# Patient Record
Sex: Male | Born: 1935 | Race: White | Hispanic: No | Marital: Married | Smoking: Former smoker
Health system: Southern US, Community
[De-identification: ages and names within clinical notes are randomized; demographics above are authoritative.]

## PROBLEM LIST (undated history)

## (undated) DIAGNOSIS — F329 Major depressive disorder, single episode, unspecified: Secondary | ICD-10-CM

## (undated) DIAGNOSIS — E785 Hyperlipidemia, unspecified: Secondary | ICD-10-CM

## (undated) DIAGNOSIS — C801 Malignant (primary) neoplasm, unspecified: Secondary | ICD-10-CM

## (undated) DIAGNOSIS — E079 Disorder of thyroid, unspecified: Secondary | ICD-10-CM

## (undated) DIAGNOSIS — Z85048 Personal history of other malignant neoplasm of rectum, rectosigmoid junction, and anus: Secondary | ICD-10-CM

## (undated) DIAGNOSIS — F32A Depression, unspecified: Secondary | ICD-10-CM

## (undated) HISTORY — DX: Major depressive disorder, single episode, unspecified: F32.9

## (undated) HISTORY — PX: LARYNGECTOMY: SUR815

## (undated) HISTORY — PX: BACK SURGERY: SHX140

## (undated) HISTORY — DX: Disorder of thyroid, unspecified: E07.9

## (undated) HISTORY — PX: WRIST SURGERY: SHX841

## (undated) HISTORY — DX: Depression, unspecified: F32.A

## (undated) HISTORY — DX: Personal history of other malignant neoplasm of rectum, rectosigmoid junction, and anus: Z85.048

## (undated) HISTORY — DX: Hyperlipidemia, unspecified: E78.5

## (undated) HISTORY — PX: LUNG BIOPSY: SHX232

---

## 1999-06-20 DIAGNOSIS — Z8521 Personal history of malignant neoplasm of larynx: Secondary | ICD-10-CM | POA: Insufficient documentation

## 2010-06-19 DIAGNOSIS — Z8619 Personal history of other infectious and parasitic diseases: Secondary | ICD-10-CM | POA: Insufficient documentation

## 2010-06-19 DIAGNOSIS — Z85048 Personal history of other malignant neoplasm of rectum, rectosigmoid junction, and anus: Secondary | ICD-10-CM | POA: Insufficient documentation

## 2010-06-19 DIAGNOSIS — Z8781 Personal history of (healed) traumatic fracture: Secondary | ICD-10-CM | POA: Insufficient documentation

## 2011-02-04 ENCOUNTER — Emergency Department: Payer: Self-pay | Admitting: *Deleted

## 2011-02-05 DIAGNOSIS — S22009A Unspecified fracture of unspecified thoracic vertebra, initial encounter for closed fracture: Secondary | ICD-10-CM | POA: Insufficient documentation

## 2011-04-12 ENCOUNTER — Ambulatory Visit: Payer: Self-pay | Admitting: Internal Medicine

## 2011-07-27 DIAGNOSIS — B449 Aspergillosis, unspecified: Secondary | ICD-10-CM | POA: Insufficient documentation

## 2011-07-27 DIAGNOSIS — C20 Malignant neoplasm of rectum: Secondary | ICD-10-CM | POA: Insufficient documentation

## 2012-05-20 DIAGNOSIS — C329 Malignant neoplasm of larynx, unspecified: Secondary | ICD-10-CM | POA: Insufficient documentation

## 2017-11-21 DIAGNOSIS — Z930 Tracheostomy status: Secondary | ICD-10-CM | POA: Insufficient documentation

## 2018-01-18 ENCOUNTER — Ambulatory Visit: Payer: Self-pay | Admitting: Urology

## 2018-02-11 ENCOUNTER — Ambulatory Visit (INDEPENDENT_AMBULATORY_CARE_PROVIDER_SITE_OTHER): Payer: Medicare Other | Admitting: Urology

## 2018-02-11 ENCOUNTER — Encounter: Payer: Self-pay | Admitting: Urology

## 2018-02-11 VITALS — BP 136/75 | HR 67 | Ht 63.0 in | Wt 155.0 lb

## 2018-02-11 DIAGNOSIS — N138 Other obstructive and reflux uropathy: Secondary | ICD-10-CM | POA: Diagnosis not present

## 2018-02-11 DIAGNOSIS — N401 Enlarged prostate with lower urinary tract symptoms: Secondary | ICD-10-CM | POA: Diagnosis not present

## 2018-02-11 DIAGNOSIS — R972 Elevated prostate specific antigen [PSA]: Secondary | ICD-10-CM

## 2018-02-11 LAB — URINALYSIS, COMPLETE
Bilirubin, UA: NEGATIVE
GLUCOSE, UA: NEGATIVE
KETONES UA: NEGATIVE
Leukocytes, UA: NEGATIVE
NITRITE UA: NEGATIVE
Protein, UA: NEGATIVE
RBC, UA: NEGATIVE
Specific Gravity, UA: 1.02 (ref 1.005–1.030)
UUROB: 0.2 mg/dL (ref 0.2–1.0)
pH, UA: 6.5 (ref 5.0–7.5)

## 2018-02-11 LAB — MICROSCOPIC EXAMINATION
EPITHELIAL CELLS (NON RENAL): NONE SEEN /HPF (ref 0–10)
WBC UA: NONE SEEN /HPF (ref 0–5)

## 2018-02-11 NOTE — Progress Notes (Signed)
02/11/2018 10:13 AM   Ryan Vazquez [birth year] [id number]  Referring provider: Adin Hector, MD [facility] [place], [place] [address]  Chief Complaint  Patient presents with  . Elevated PSA    HPI: 83 year old male who saw Dr. Caryl Comes in early June 2019 with worsening lower urinary tract symptoms which occurred after ENT surgery for a tracheostomy revision.  He was started on tamsulosin and empiric antibiotic therapy.  He subsequently discontinued the tamsulosin.  A PSA was drawn which was 8.62.  There were no prior PSA results for comparison.  He presents today with his daughter.  He is presently back at baseline regarding his voiding symptoms.  He has urinary frequency and occasional episodes of urge incontinence.  He states the symptoms are not bothersome.  Denies dysuria or gross hematuria.  Denies flank, abdominal, pelvic or scrotal pain.   PMH: Past Medical History:  Diagnosis Date  . Depression   . History of rectal cancer   . Hyperlipidemia   . Thyroid disease     Surgical History: Past Surgical History:  Procedure Laterality Date  . LARYNGECTOMY    . WRIST SURGERY      Home Medications:  Allergies as of 02/11/2018      Reactions   Lactose    Other reaction(s): Other (See Comments), Other (See Comments)      Medication List        Accurate as of 02/11/18 10:13 AM. Always use your most recent med list.          acetaminophen 325 MG tablet Commonly known as:  TYLENOL Take by mouth.   fexofenadine 180 MG tablet Commonly known as:  ALLEGRA Take by mouth.   ibuprofen 200 MG tablet Commonly known as:  ADVIL,MOTRIN Take by mouth.   levothyroxine 50 MCG tablet Commonly known as:  SYNTHROID, LEVOTHROID TAKE 1 TABLET ON AN EMPTY STOMACH WITH A GLASS OF WATER AT LEAST 30-60 MINUTES BEFORE BREAKFAST.   montelukast 10 MG tablet Commonly known as:  SINGULAIR Take by mouth.       Allergies:  Allergies    Allergen Reactions  . Lactose     Other reaction(s): Other (See Comments), Other (See Comments)    Family History: Family History  Problem Relation Age of Onset  . Heart disease Father     Social History:  reports that he has never smoked. He has never used smokeless tobacco. He reports that he drinks alcohol. He reports that he does not use drugs.  ROS: UROLOGY Frequent Urination?: Yes Hard to postpone urination?: No Burning/pain with urination?: No Get up at night to urinate?: No Leakage of urine?: Yes Urine stream starts and stops?: No Trouble starting stream?: No Do you have to strain to urinate?: No Blood in urine?: No Urinary tract infection?: No Sexually transmitted disease?: No Injury to kidneys or bladder?: No Painful intercourse?: No Weak stream?: No Erection problems?: No Penile pain?: No  Gastrointestinal Nausea?: No Vomiting?: No Indigestion/heartburn?: No Diarrhea?: No Constipation?: No  Constitutional Fever: No Night sweats?: No Weight loss?: No Fatigue?: No  Skin Skin rash/lesions?: No Itching?: No  Eyes Blurred vision?: No Double vision?: No  Ears/Nose/Throat Sore throat?: No Sinus problems?: No  Hematologic/Lymphatic Swollen glands?: No Easy bruising?: No  Cardiovascular Leg swelling?: No Chest pain?: No  Respiratory Cough?: No Shortness of breath?: No  Endocrine Excessive thirst?: No  Musculoskeletal Back pain?: No Joint pain?: No  Neurological Headaches?: No Dizziness?: No  Psychologic  Depression?: No Anxiety?: No  Physical Exam: BP 136/75 (BP Location: Left Arm, Patient Position: Sitting, Cuff Size: Normal)   Pulse 67   Ht 5\' 3"  (1.6 m)   Wt 155 lb (70.3 kg)   BMI 27.46 kg/m   Constitutional:  Alert and oriented, No acute distress. HEENT: [place] AT, moist mucus membranes.  Trachea midline, no masses. Cardiovascular: No clubbing, cyanosis, or edema. Respiratory: Normal respiratory effort, no increased work  of breathing. GI: Abdomen is soft, nontender, nondistended, no abdominal masses GU: No CVA tenderness.  Prostate 60 g, smooth without nodules Lymph: No cervical or inguinal lymphadenopathy. Skin: No rashes, bruises or suspicious lesions. Neurologic: Grossly intact, no focal deficits, moving all 4 extremities. Psychiatric: Normal mood and affect.   Assessment & Plan:    1. BPH with obstruction/lower urinary tract symptoms His lower urinary tract symptoms are not bothersome and he does not desire medical management of his symptoms.  2. Elevated PSA His DRE is benign.  Based on AUA recommendations prostate cancer screening after age 66 is not recommended.  He and his daughter were informed that approximately 50% of men in their 37s will have low-grade prostate cancer.  Other causes of an elevated PSA were discussed including BPH and inflammation.  I discussed options of prostate biopsy versus surveillance.  They would like to think this over.  Will repeat his PSA today.   Ryan Vazquez, Ryan Vazquez [address], [place], [place] [address] [phone]

## 2018-02-12 LAB — PSA: Prostate Specific Ag, Serum: 6.5 ng/mL — ABNORMAL HIGH (ref 0.0–4.0)

## 2018-02-13 ENCOUNTER — Telehealth: Payer: Self-pay

## 2018-02-13 DIAGNOSIS — R972 Elevated prostate specific antigen [PSA]: Secondary | ICD-10-CM

## 2018-02-13 DIAGNOSIS — N401 Enlarged prostate with lower urinary tract symptoms: Principal | ICD-10-CM

## 2018-02-13 DIAGNOSIS — N138 Other obstructive and reflux uropathy: Secondary | ICD-10-CM

## 2018-02-13 NOTE — Telephone Encounter (Signed)
Pt daughter informed

## 2018-02-13 NOTE — Telephone Encounter (Signed)
-----   Message from [name], MD sent at 02/12/2018  8:45 PM EDT ----- PSA better at 6.5.  Recc repeat psa 6 months

## 2018-08-12 ENCOUNTER — Other Ambulatory Visit: Payer: Medicare Other

## 2018-08-12 DIAGNOSIS — N401 Enlarged prostate with lower urinary tract symptoms: Principal | ICD-10-CM

## 2018-08-12 DIAGNOSIS — N138 Other obstructive and reflux uropathy: Secondary | ICD-10-CM

## 2018-08-12 DIAGNOSIS — R972 Elevated prostate specific antigen [PSA]: Secondary | ICD-10-CM

## 2018-08-13 LAB — PSA: PROSTATE SPECIFIC AG, SERUM: 6.4 ng/mL — AB (ref 0.0–4.0)

## 2018-08-14 ENCOUNTER — Telehealth: Payer: Self-pay | Admitting: Family Medicine

## 2018-08-14 NOTE — Telephone Encounter (Signed)
-----   Message from [name], MD sent at 08/13/2018  1:14 PM EST ----- Repeat PSA is stable at 6.4.  Since the PSA is stable surveillance can be continued versus scheduling a prostate biopsy.  If he desires surveillance would recheck in 6 months.  Would be happy to see him in follow-up to discuss options if he desires.

## 2018-08-14 NOTE — Telephone Encounter (Signed)
Patient notified and he will think about what he will want to do. He also wants to talk to his PCP first.

## 2018-09-01 DIAGNOSIS — E7849 Other hyperlipidemia: Secondary | ICD-10-CM | POA: Insufficient documentation

## 2020-04-05 ENCOUNTER — Other Ambulatory Visit: Payer: Self-pay | Admitting: Internal Medicine

## 2020-04-05 DIAGNOSIS — R0989 Other specified symptoms and signs involving the circulatory and respiratory systems: Secondary | ICD-10-CM

## 2020-04-12 ENCOUNTER — Other Ambulatory Visit: Payer: Self-pay

## 2020-04-12 ENCOUNTER — Ambulatory Visit
Admission: RE | Admit: 2020-04-12 | Discharge: 2020-04-12 | Disposition: A | Discharge status: Home / Self Care | Payer: Medicare Other | Source: Ambulatory Visit | Attending: Internal Medicine | Admitting: Internal Medicine

## 2020-04-12 DIAGNOSIS — R0989 Other specified symptoms and signs involving the circulatory and respiratory systems: Secondary | ICD-10-CM

## 2020-05-11 ENCOUNTER — Other Ambulatory Visit: Payer: Self-pay

## 2020-05-11 ENCOUNTER — Encounter (INDEPENDENT_AMBULATORY_CARE_PROVIDER_SITE_OTHER): Payer: Self-pay | Admitting: Vascular Surgery

## 2020-05-11 ENCOUNTER — Ambulatory Visit (INDEPENDENT_AMBULATORY_CARE_PROVIDER_SITE_OTHER): Payer: Medicare Other | Admitting: Vascular Surgery

## 2020-05-11 VITALS — BP 156/87 | HR 84 | Ht 64.0 in | Wt 154.0 lb

## 2020-05-11 DIAGNOSIS — I6523 Occlusion and stenosis of bilateral carotid arteries: Secondary | ICD-10-CM

## 2020-05-11 DIAGNOSIS — E7849 Other hyperlipidemia: Secondary | ICD-10-CM | POA: Diagnosis not present

## 2020-05-11 DIAGNOSIS — Z8521 Personal history of malignant neoplasm of larynx: Secondary | ICD-10-CM

## 2020-05-11 DIAGNOSIS — I6529 Occlusion and stenosis of unspecified carotid artery: Secondary | ICD-10-CM | POA: Insufficient documentation

## 2020-05-11 MED ORDER — ATORVASTATIN CALCIUM 10 MG PO TABS: 10.0000 mg | ORAL_TABLET | Freq: Every day | ORAL | 11 refills | Status: AC

## 2020-05-11 NOTE — Assessment & Plan Note (Signed)
Particularly with his recent diagnosis of carotid stenosis which is moderate on the left, I would recommend initiation of a statin agent at this time.  I will send in the first prescription and they will be following with her primary care physician ongoing.

## 2020-05-11 NOTE — Patient Instructions (Signed)
Carotid Artery Disease  Carotid artery disease is the narrowing or blockage of one or both carotid arteries. This condition is also called carotid artery stenosis. The carotid arteries are the two main blood vessels on either side of the neck. They send blood to the brain, other parts of the head, and the neck.  This condition increases your risk for a stroke or a transient ischemic attack (TIA). A TIA is a "mini-stroke" that causes stroke-like symptoms that go away quickly. What are the causes? This condition is mainly caused by a narrowing and hardening of the carotid arteries. The carotid arteries can become narrow or clogged with a buildup of plaque. Plaque includes:  Fat.  Cholesterol.  Calcium.  Other substances. What increases the risk? The following factors may make you more likely to develop this condition:  Having certain medical conditions, such as: ? High cholesterol. ? High blood pressure. ? Diabetes. ? Obesity.  Smoking.  A family history of cardiovascular disease.  Not being active or lack of regular exercise.  Being male. Men have a higher risk of having arteries become narrow and harden earlier in life than women.  Old age. What are the signs or symptoms? This condition may not have any signs or symptoms until a stroke or TIA happens. In some cases, your doctor may be able to hear a whooshing sound. This can suggest a change in blood flow caused by plaque buildup. An eye exam can also help find signs of the condition. How is this treated? This condition may be treated with more than one treatment. Treatment options include:  Lifestyle changes, such as: ? Quitting smoking. ? Getting regular exercise, or getting exercise as told by your doctor. ? Eating a healthy diet. ? Managing stress. ? Keeping a healthy weight.  Medicines to control: ? Blood pressure. ? Cholesterol. ? Blood clotting.  Surgery. You may have: ? A surgery to remove the blockages in  the carotid arteries. ? A procedure in which a small mesh tube (stent) is used to widen the blocked carotid arteries. Follow these instructions at home: Eating and drinking Follow instructions about your diet from your doctor. It is important to follow a healthy diet.  Eat a diet that includes: ? A lot of fresh fruits and vegetables. ? Low-fat (lean) meats.  Avoid these foods: ? Foods that are high in fat. ? Foods that are high in salt (sodium). ? Foods that are fried. ? Foods that are processed. ? Foods that have few good nutrients (poor nutritional value).  Lifestyle   Keep a healthy weight.  Do exercises as told by your doctor to stay active. Each week, you should get one of the following: ? At least 150 minutes of exercise that raises your heart rate and makes you sweat (moderate-intensity exercise). ? At least 75 minutes of exercise that takes a lot of effort.  Do not use any products that contain nicotine or tobacco, such as cigarettes, e-cigarettes, and chewing tobacco. If you need help quitting, ask your doctor.  Do not drink alcohol if: ? Your doctor tells you not to drink. ? You are pregnant, may be pregnant, or are planning to become pregnant.  If you drink alcohol: ? Limit how much you use to:  0-1 drink a day for women.  0-2 drinks a day for men. ? Be aware of how much alcohol is in your drink. In the [place] one drink equals one 12 oz bottle of beer (355 mL), one 5   oz glass of wine (148 mL), or one 1 oz glass of hard liquor (44 mL).  Do not use drugs.  Manage your stress. Ask your doctor for tips on how to do this. General instructions  Take over-the-counter and prescription medicines only as told by your doctor.  Keep all follow-up visits as told by your doctor. This is important. Where to find more information  American Heart Association: www.heart.org Get help right away if:  You have any signs of a stroke. "BE FAST" is an easy way to remember the  main warning signs: ? B - Balance. Signs are dizziness, sudden trouble walking, or loss of balance. ? E - Eyes. Signs are trouble seeing or a change in how you see. ? F - Face. Signs are sudden weakness or loss of feeling of the face, or the face or eyelid drooping on one side. ? A - Arms. Signs are weakness or loss of feeling in an arm. This happens suddenly and usually on one side of the body. ? S - Speech. Signs are sudden trouble speaking, slurred speech, or trouble understanding what people say. ? T - Time. Time to call emergency services. Write down what time symptoms started.  You have other signs of a stroke, such as: ? A sudden, very bad headache with no known cause. ? Feeling like you may vomit (nausea). ? Vomiting. ? A seizure. These symptoms may be an emergency. Do not wait to see if the symptoms will go away. Get medical help right away. Call your local emergency services (911 in the [place] Do not drive yourself to the hospital. Summary  The carotid arteries are blood vessels on both sides of the neck.  If these arteries get smaller or get blocked, you are more likely to have a stroke or a mini-stroke.  This condition can be treated with lifestyle changes, medicines, surgery, or a blend of these treatments.  Get help right away if you have any signs of a stroke. "BE FAST" is an easy way to remember the main warning signs of stroke. This information is not intended to replace advice given to you by your health care provider. Make sure you discuss any questions you have with your health care provider. Document Revised: 12/30/2018 Document Reviewed: 12/30/2018 Elsevier Patient Education  2020 Elsevier Inc.  

## 2020-05-11 NOTE — Assessment & Plan Note (Signed)
carotid duplex done at the hospital last month which I have reviewed.  Although the official report flipped the sides and the impression, and the body of the report matches the images which suggest a 50 to 69% left ICA stenosis and a 1 to 49% right ICA stenosis. At this level, no immediate intervention is required.  This will need to be followed fairly closely and I will plan to see him back in 3 to 4 months with a duplex. As for management, he already takes an aspirin a day.  I am going to add a low-dose of a statin agent at this time and his primary care physician had already discussed potentially using this going forward.  We will see him back with his noninvasive studies in several months.

## 2020-05-11 NOTE — Progress Notes (Signed)
Patient ID: Ryan Vazquez, male   DOB: 12-Jan-1935, 85 y.o.   MRN: [id number]  Chief Complaint  Patient presents with  . New Patient (Initial Visit)    Caryl Comes. Carotid req     HPI Ryan Vazquez is a 85 y.o. male.  I am asked to see the patient by Dr. Caryl Comes for evaluation of carotid stenosis.  The patient reports some decreased activity levels and some numbness and tingling in the fingers. No focal neurologic symptoms.  The patient has had a total laryngectomy and cannot really provide the history.  His daughter accompanies him who provides the history.  This was about 20 years ago and did have radiation to his neck after his laryngectomy.  He is also had treatment for rectal cancer.  This prompted a carotid duplex done at the hospital last month which I have reviewed.  Although the official report flipped the sides and the impression, and the body of the report matches the images which suggest a 50 to 69% left ICA stenosis and a 1 to 49% right ICA stenosis.  Given these findings, he is referred for further evaluation and treatment.   Past Medical History:  Diagnosis Date  . Depression   . History of rectal cancer   . Hyperlipidemia   . Thyroid disease     Past Surgical History:  Procedure Laterality Date  . LARYNGECTOMY    . WRIST SURGERY    : Surgery of the colon for cancer.  Family History  Problem Relation Age of Onset  . Heart disease Father   no bleeding disorders, clotting disorders or aneurysms   Social History   Tobacco Use  . Smoking status: Never Smoker  . Smokeless tobacco: Never Used  Vaping Use  . Vaping Use: Never used  Substance Use Topics  . Alcohol use: Yes  . Drug use: Never     Allergies  Allergen Reactions  . Lactose     Other reaction(s): Other (See Comments), Other (See Comments)    Current Outpatient Medications  Medication Sig Dispense Refill  . acetaminophen (TYLENOL) 325 MG tablet Take by mouth.    . B Complex  Vitamins (B COMPLEX 1 PO) Take by mouth.    . calcium carbonate 100 mg/ml SUSP Take by mouth.    . fexofenadine (ALLEGRA) 180 MG tablet Take by mouth.    Marland Kitchen ibuprofen (ADVIL,MOTRIN) 200 MG tablet Take by mouth.    . levothyroxine (SYNTHROID, LEVOTHROID) 50 MCG tablet TAKE 1 TABLET ON AN EMPTY STOMACH WITH A GLASS OF WATER AT LEAST 30-60 MINUTES BEFORE BREAKFAST.  11  . Multiple Vitamins-Minerals (CENTRUM SILVER 50+MEN PO) Take by mouth.    . Omega-3 1000 MG CAPS Take by mouth.    . Omega-3 Fatty Acids (FISH OIL) 1000 MG CAPS Take by mouth.    . montelukast (SINGULAIR) 10 MG tablet Take by mouth.     No current facility-administered medications for this visit.      REVIEW OF SYSTEMS (Negative unless checked)  Constitutional: [] Weight loss  [] Fever  [] Chills Cardiac: [] Chest pain   [] Chest pressure   [] Palpitations   [] Shortness of breath when laying flat   [] Shortness of breath at rest   [] Shortness of breath with exertion. Vascular:  [] Pain in legs with walking   [] Pain in legs at rest   [] Pain in legs when laying flat   [] Claudication   [] Pain in feet when walking  [] Pain in feet at rest  [] Pain in feet when  laying flat   [] History of DVT   [] Phlebitis   [] Swelling in legs   [] Varicose veins   [] Non-healing ulcers Pulmonary:   [] Uses home oxygen   [] Productive cough   [] Hemoptysis   [] Wheeze  [] COPD   [] Asthma Neurologic:  [x] Dizziness  [] Blackouts   [] Seizures   [] History of stroke   [] History of TIA  [] Aphasia   [] Temporary blindness   [] Dysphagia   [x] Weakness or numbness in arms   [] Weakness or numbness in legs Musculoskeletal:  [x] Arthritis   [] Joint swelling   [] Joint pain   [] Low back pain Hematologic:  [] Easy bruising  [] Easy bleeding   [] Hypercoagulable state   [] Anemic  [] Hepatitis Gastrointestinal:  [] Blood in stool   [] Vomiting blood  [] Gastroesophageal reflux/heartburn   [] Abdominal pain Genitourinary:  [] Chronic kidney disease   [] Difficult urination  [] Frequent urination   [] Burning with urination   [] Hematuria Skin:  [] Rashes   [] Ulcers   [] Wounds Psychological:  [] History of anxiety   [x]  History of major depression.    Physical Exam BP (!) 156/87   Pulse 84   Ht 5\' 4"  (1.626 m)   Wt 154 lb (69.9 kg)   BMI 26.43 kg/m  Gen:  WD/WN, NAD Head: /AT, No temporalis wasting.  Ear/Nose/Throat: Hearing grossly intact, nares w/o erythema or drainage, oropharynx w/o Erythema/Exudate Eyes: Conjunctiva clear, sclera non-icteric  Neck: Tracheostomy present after previous laryngectomy.  Unable to speak.  Bilateral carotid bruits Pulmonary:  Good air movement, clear to auscultation bilaterally.  Cardiac: RRR, normal S1, S2 Vascular:  Vessel Right Left  Radial  1+ palpable  2+ palpable               Musculoskeletal: M/S 5/5 throughout.  Extremities without ischemic changes.  No deformity or atrophy.  No edema. Neurologic: Sensation grossly intact in extremities.  Symmetrical.  Speech is not present due to previous laryngectomy. Motor exam as listed above. Psychiatric: Judgment intact, Mood & affect appropriate for pt's clinical situation. Dermatologic: No rashes or ulcers noted.  No cellulitis or open wounds.    Radiology US Carotid Bilateral  Result Date: 04/12/2020 CLINICAL DATA:  Hyperlipidemia, carotid bruits, hypertension EXAM: BILATERAL CAROTID DUPLEX ULTRASOUND TECHNIQUE: Pearline Cables scale imaging, color Doppler and duplex ultrasound were performed of bilateral carotid and vertebral arteries in the neck. COMPARISON:  None. FINDINGS: Criteria: Quantification of carotid stenosis is based on velocity parameters that correlate the residual internal carotid diameter with NASCET-based stenosis levels, using the diameter of the distal internal carotid lumen as the denominator for stenosis measurement. The following velocity measurements were obtained: RIGHT ICA: 77/30 cm/sec CCA: 048/88 cm/sec SYSTOLIC ICA/CCA RATIO:  0.7 ECA: 71 cm/sec LEFT ICA: 223/64 cm/sec CCA:  91/69 cm/sec SYSTOLIC ICA/CCA RATIO:  2.7 ECA: 213 cm/sec RIGHT CAROTID ARTERY: Moderate intimal thickening and mixed echogenicity partially calcified plaque formation. Despite this, there is no hemodynamically significant right ICA stenosis, velocity elevation, or turbulent flow. Degree of narrowing less than 50% by ultrasound criteria. RIGHT VERTEBRAL ARTERY: Retrograde flow compatible with subclavian steal phenomenon LEFT CAROTID ARTERY: More severe mixed echogenicity left common and internal carotid artery atherosclerotic plaque formation. Proximal left ICA velocity elevation measures up to 223/64 centimeters/second. Left ICA CCA ratio 2.7. Left ICA stenosis estimated at 50-69% by ultrasound criteria. LEFT VERTEBRAL ARTERY:  Normal antegrade flow IMPRESSION: Bilateral carotid atherosclerosis, worse on the left. Left ICA narrowing less than 50% Right left ICA stenosis estimated at 50-69% Retrograde right vertebral artery flow, compatible with subclavian steal phenomenon Antegrade left vertebral  artery flow Electronically Signed   By: Jerilynn Mages.  Shick M.D.   On: 04/12/2020 12:48    Labs No results found for this or any previous visit (from the past 2160 hour(s)).  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  He also had radiation which could contribute to his carotid stenosis.  Other hyperlipidemia Particularly with his recent diagnosis of carotid stenosis which is moderate on the left, I would recommend initiation of a statin agent at this time.  I will send in the first prescription and they will be following with her primary care physician ongoing.  Carotid stenosis carotid duplex done at the hospital last month which I have reviewed.  Although the official report flipped the sides and the impression, and the body of the report matches the images which suggest a 50 to 69% left ICA stenosis and a 1 to 49% right ICA stenosis. At this level, no immediate  intervention is required.  This will need to be followed fairly closely and I will plan to see him back in 3 to 4 months with a duplex. As for management, he already takes an aspirin a day.  I am going to add a low-dose of a statin agent at this time and his primary care physician had already discussed potentially using this going forward.  We will see him back with his noninvasive studies in several months.      [name] 05/11/2020, 10:01 AM   This note was created with Dragon medical transcription system.  Any errors from dictation are unintentional.

## 2020-05-11 NOTE — Assessment & Plan Note (Signed)
Status post laryngectomy and tracheostomy.  He also had radiation which could contribute to his carotid stenosis.

## 2020-08-02 DIAGNOSIS — I6523 Occlusion and stenosis of bilateral carotid arteries: Secondary | ICD-10-CM | POA: Insufficient documentation

## 2020-08-06 ENCOUNTER — Other Ambulatory Visit: Payer: Self-pay | Admitting: Physician Assistant

## 2020-08-06 DIAGNOSIS — R7989 Other specified abnormal findings of blood chemistry: Secondary | ICD-10-CM

## 2020-08-09 ENCOUNTER — Other Ambulatory Visit: Payer: Self-pay | Admitting: Physician Assistant

## 2020-08-09 ENCOUNTER — Ambulatory Visit
Admission: RE | Admit: 2020-08-09 | Discharge: 2020-08-09 | Disposition: A | Discharge status: Home / Self Care | Payer: Medicare Other | Source: Ambulatory Visit | Attending: Physician Assistant | Admitting: Physician Assistant

## 2020-08-09 ENCOUNTER — Other Ambulatory Visit: Payer: Self-pay

## 2020-08-09 ENCOUNTER — Other Ambulatory Visit (HOSPITAL_COMMUNITY): Payer: Self-pay | Admitting: Physician Assistant

## 2020-08-09 DIAGNOSIS — R7989 Other specified abnormal findings of blood chemistry: Secondary | ICD-10-CM

## 2020-08-10 ENCOUNTER — Encounter (INDEPENDENT_AMBULATORY_CARE_PROVIDER_SITE_OTHER): Payer: Medicare Other

## 2020-08-10 ENCOUNTER — Ambulatory Visit (INDEPENDENT_AMBULATORY_CARE_PROVIDER_SITE_OTHER): Payer: Medicare Other | Admitting: Vascular Surgery

## 2020-08-18 ENCOUNTER — Other Ambulatory Visit: Payer: Self-pay | Admitting: Internal Medicine

## 2020-08-18 DIAGNOSIS — K769 Liver disease, unspecified: Secondary | ICD-10-CM

## 2020-08-18 DIAGNOSIS — E7849 Other hyperlipidemia: Secondary | ICD-10-CM

## 2020-09-05 ENCOUNTER — Ambulatory Visit
Admission: RE | Admit: 2020-09-05 | Discharge: 2020-09-05 | Disposition: A | Discharge status: Home / Self Care | Payer: Medicare Other | Source: Ambulatory Visit | Attending: Internal Medicine | Admitting: Internal Medicine

## 2020-09-05 ENCOUNTER — Other Ambulatory Visit: Payer: Self-pay

## 2020-09-05 DIAGNOSIS — K769 Liver disease, unspecified: Secondary | ICD-10-CM | POA: Diagnosis present

## 2020-09-05 DIAGNOSIS — E7849 Other hyperlipidemia: Secondary | ICD-10-CM | POA: Diagnosis present

## 2020-09-05 MED ORDER — GADOBUTROL 1 MMOL/ML IV SOLN
7.5000 mL | Freq: Once | INTRAVENOUS | Status: AC | PRN
  Administered 2020-09-05: 7 mL via INTRAVENOUS

## 2020-09-07 ENCOUNTER — Ambulatory Visit (INDEPENDENT_AMBULATORY_CARE_PROVIDER_SITE_OTHER): Payer: Medicare Other | Admitting: Vascular Surgery

## 2020-09-07 ENCOUNTER — Ambulatory Visit (INDEPENDENT_AMBULATORY_CARE_PROVIDER_SITE_OTHER): Payer: Medicare Other

## 2020-09-07 ENCOUNTER — Other Ambulatory Visit: Payer: Self-pay

## 2020-09-07 VITALS — BP 126/83 | HR 83 | Resp 16 | Ht 65.0 in | Wt 152.0 lb

## 2020-09-07 DIAGNOSIS — I6523 Occlusion and stenosis of bilateral carotid arteries: Secondary | ICD-10-CM | POA: Diagnosis not present

## 2020-09-07 NOTE — H&P (View-Only) (Signed)
MRN : [id number]  Ryan Vazquez is a 86 y.o. (January 24, 1935) male who presents with chief complaint of  Chief Complaint  Patient presents with  . Follow-up    ultrasound  .   History of Present Illness: patient returns in follow up of his carotid disease. His history is provided by his daughter as he is unable to speak status post laryngectomy and tracheostomy.  She reports him being more tired and not having much energy.  He has had dizziness as well.  No obvious focal neurologic symptoms such as arm or leg weakness or numbness or swallowing difficulty.  He was started on a statin agent at his last visit several months ago and continues on aspirin. Duplex today shows progression of his carotid disease now in the 40 to 59% range on the right but in the 80 to 99% range on the left.  This has progressed fairly significantly over the past several months.  Current Outpatient Medications  Medication Sig Dispense Refill  . acetaminophen (TYLENOL) 325 MG tablet Take by mouth.    Marland Kitchen atorvastatin (LIPITOR) 10 MG tablet Take 1 tablet (10 mg total) by mouth daily. 30 tablet 11  . B Complex Vitamins (B COMPLEX 1 PO) Take by mouth.    . calcium carbonate 100 mg/ml SUSP Take by mouth.    . clopidogrel (PLAVIX) 75 MG tablet Take 1 tablet (75 mg total) by mouth daily. 30 tablet 6  . clopidogrel (PLAVIX) 75 MG tablet Take 1 tablet (75 mg total) by mouth daily. 30 tablet 6  . fexofenadine (ALLEGRA) 180 MG tablet Take by mouth.    Marland Kitchen ibuprofen (ADVIL,MOTRIN) 200 MG tablet Take by mouth.    . levothyroxine (SYNTHROID, LEVOTHROID) 50 MCG tablet TAKE 1 TABLET ON AN EMPTY STOMACH WITH A GLASS OF WATER AT LEAST 30-60 MINUTES BEFORE BREAKFAST.  11  . Multiple Vitamins-Minerals (CENTRUM SILVER 50+MEN PO) Take by mouth.    . Omega-3 1000 MG CAPS Take by mouth.    Marland Kitchen aspirin 81 MG EC tablet Take by mouth.    . montelukast (SINGULAIR) 10 MG tablet Take by mouth.    . Omega-3 Fatty Acids (FISH OIL) 1000 MG CAPS  Take by mouth.     No current facility-administered medications for this visit.    Past Medical History:  Diagnosis Date  . Depression   . History of rectal cancer   . Hyperlipidemia   . Thyroid disease     Past Surgical History:  Procedure Laterality Date  . LARYNGECTOMY    . WRIST SURGERY      Family History  Problem Relation Age of Onset  . Heart disease Father   no bleeding disorders, clotting disorders or aneurysms   Social History       Tobacco Use  . Smoking status: Never Smoker  . Smokeless tobacco: Never Used  Vaping Use  . Vaping Use: Never used  Substance Use Topics  . Alcohol use: Yes  . Drug use: Never     Allergies  Allergen Reactions  . Lactose     Other reaction(s): Other (See Comments), Other (See Comments)  . Sulfamethoxazole-Trimethoprim Other (See Comments)    cholestasis    REVIEW OF SYSTEMS (Negative unless checked)  Constitutional: [] ?Weight loss  [] ?Fever  [] ?Chills Cardiac: [] ?Chest pain   [] ?Chest pressure   [] ?Palpitations   [] ?Shortness of breath when laying flat   [] ?Shortness of breath at rest   [] ?Shortness of breath with exertion. Vascular:  [] ?Pain in  legs with walking   [] ?Pain in legs at rest   [] ?Pain in legs when laying flat   [] ?Claudication   [] ?Pain in feet when walking  [] ?Pain in feet at rest  [] ?Pain in feet when laying flat   [] ?History of DVT   [] ?Phlebitis   [] ?Swelling in legs   [] ?Varicose veins   [] ?Non-healing ulcers Pulmonary:   [] ?Uses home oxygen   [] ?Productive cough   [] ?Hemoptysis   [] ?Wheeze  [] ?COPD   [] ?Asthma Neurologic:  [x] ?Dizziness  [] ?Blackouts   [] ?Seizures   [] ?History of stroke   [] ?History of TIA  [] ?Aphasia   [] ?Temporary blindness   [] ?Dysphagia   [x] ?Weakness or numbness in arms   [] ?Weakness or numbness in legs Musculoskeletal:  [x] ?Arthritis   [] ?Joint swelling   [] ?Joint pain   [] ?Low back pain Hematologic:  [] ?Easy bruising  [] ?Easy bleeding   [] ?Hypercoagulable state   [] ?Anemic   [] ?Hepatitis Gastrointestinal:  [] ?Blood in stool   [] ?Vomiting blood  [] ?Gastroesophageal reflux/heartburn   [] ?Abdominal pain Genitourinary:  [] ?Chronic kidney disease   [] ?Difficult urination  [] ?Frequent urination  [] ?Burning with urination   [] ?Hematuria Skin:  [] ?Rashes   [] ?Ulcers   [] ?Wounds Psychological:  [] ?History of anxiety   [x] ? History of major depression.  Physical Examination  Vitals:   09/07/20 1610  BP: 126/83  Pulse: 83  Resp: 16  Weight: 152 lb (68.9 kg)  Height: 5\' 5"  (1.651 m)   Body mass index is 25.29 kg/m. Gen:  WD/WN, NAD Head: [place]/AT, No temporalis wasting. Ear/Nose/Throat: Hearing grossly intact, nares w/o erythema or drainage, trachea midline Eyes: Conjunctiva clear. Sclera non-icteric Neck: Supple.  Left carotid bruit. S/p laryngectomy Pulmonary:  Good air movement, equal and clear to auscultation bilaterally.  Cardiac: RRR, No JVD Vascular:  Vessel Right Left  Radial Palpable Palpable           Musculoskeletal: M/S 5/5 throughout.  No deformity or atrophy. No edema. Neurologic: CN 2-12 intact. Sensation grossly intact in extremities.  Symmetrical.  Unable to speak. Motor exam as listed above. Psychiatric: Judgment intact, Mood & affect appropriate for pt's clinical situation. Dermatologic: No rashes or ulcers noted.  No cellulitis or open wounds.      CBC No results found for: WBC, HGB, HCT, MCV, PLT  BMET No results found for: NA, K, CL, CO2, GLUCOSE, BUN, CREATININE, CALCIUM, GFRNONAA, GFRAA CrCl cannot be calculated (No successful lab value found.).  COAG No results found for: INR, PROTIME  Radiology MR ABDOMEN WWO CONTRAST  Result Date: 09/06/2020 CLINICAL DATA:  Characterize left lobe liver lesion identified by ultrasound, history of laryngeal cancer EXAM: MRI ABDOMEN WITHOUT AND WITH CONTRAST TECHNIQUE: Multiplanar multisequence MR imaging of the abdomen was performed both before and after the administration of intravenous  contrast. CONTRAST:  4mL GADAVIST GADOBUTROL 1 MMOL/ML IV SOLN COMPARISON:  Abdominal ultrasound, 08/09/2020 FINDINGS: Lower chest: No acute findings. Hepatobiliary: There is a very subtle, fluid signal lesion in the anterior left lobe of the liver measuring no greater than 7 mm (series 9, image 16), which appears to demonstrate very subtle progressive contrast enhancement (series 17, image 33). No mass or other parenchymal abnormality identified. Large gallstones in the dependent gallbladder. Pancreas: No mass, inflammatory changes, or other parenchymal abnormality identified. Spleen:  Within normal limits in size and appearance. Adrenals/Urinary Tract: No masses identified. No evidence of hydronephrosis. Stomach/Bowel: Visualized portions within the abdomen are unremarkable. Vascular/Lymphatic: No pathologically enlarged lymph nodes identified. No abdominal aortic aneurysm demonstrated. Aortic atherosclerosis. Other:  None. Musculoskeletal:  No suspicious bone lesions identified. IMPRESSION: 1. There is a very subtle, fluid signal lesion in the anterior left lobe of the liver measuring no greater than 7 mm, which appears to demonstrate very subtle progressive contrast enhancement. Although this tiny lesion is difficult to characterize, given ultrasound findings and appreciable MR characteristics this is almost certainly a tiny benign hemangioma. Metastatic disease is distinctly not favored, particularly given the absence of other suspicious findings. This could be surveilled at 6 months for stability by ultrasound if desired. 2. Cholelithiasis. Aortic Atherosclerosis (ICD10-I70.0). Electronically Signed   By: Eddie Candle M.D.   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 He also had radiation which could contribute to his carotid stenosis.  Other hyperlipidemia lipid control important in reducing the progression of atherosclerotic  disease. Continue statin therapy   Carotid stenosis Duplex today shows progression of his carotid disease now in the 40 to 59% range on the right but in the 80 to 99% range on the left.  This has progressed fairly significantly over the past several months.  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  In a patient who has already had a laryngectomy and tracheostomy with radiation to the neck, he would be a very poor candidate for open surgical repair.  We would far prefer carotid stenting in these instances.  I would like to go and start him on Plavix.  I would recommend he have this fixed in the near future at his convenience.  He wants to go home and discuss with his family the options and will call our office if he decides to have this repaired.    [name], MD  09/08/2020 10:21 AM    This note was created with Dragon medical transcription system.  Any errors from dictation are purely unintentional

## 2020-09-07 NOTE — Progress Notes (Signed)
MRN : [id number]  Ryan Vazquez is a 86 y.o. (09-03-1934) male who presents with chief complaint of  Chief Complaint  Patient presents with  . Follow-up    ultrasound  .   History of Present Illness: patient returns in follow up of his carotid disease. His history is provided by his daughter as he is unable to speak status post laryngectomy and tracheostomy.  She reports him being more tired and not having much energy.  He has had dizziness as well.  No obvious focal neurologic symptoms such as arm or leg weakness or numbness or swallowing difficulty.  He was started on a statin agent at his last visit several months ago and continues on aspirin. Duplex today shows progression of his carotid disease now in the 40 to 59% range on the right but in the 80 to 99% range on the left.  This has progressed fairly significantly over the past several months.  Current Outpatient Medications  Medication Sig Dispense Refill  . acetaminophen (TYLENOL) 325 MG tablet Take by mouth.    Marland Kitchen atorvastatin (LIPITOR) 10 MG tablet Take 1 tablet (10 mg total) by mouth daily. 30 tablet 11  . B Complex Vitamins (B COMPLEX 1 PO) Take by mouth.    . calcium carbonate 100 mg/ml SUSP Take by mouth.    . clopidogrel (PLAVIX) 75 MG tablet Take 1 tablet (75 mg total) by mouth daily. 30 tablet 6  . clopidogrel (PLAVIX) 75 MG tablet Take 1 tablet (75 mg total) by mouth daily. 30 tablet 6  . fexofenadine (ALLEGRA) 180 MG tablet Take by mouth.    Marland Kitchen ibuprofen (ADVIL,MOTRIN) 200 MG tablet Take by mouth.    . levothyroxine (SYNTHROID, LEVOTHROID) 50 MCG tablet TAKE 1 TABLET ON AN EMPTY STOMACH WITH A GLASS OF WATER AT LEAST 30-60 MINUTES BEFORE BREAKFAST.  11  . Multiple Vitamins-Minerals (CENTRUM SILVER 50+MEN PO) Take by mouth.    . Omega-3 1000 MG CAPS Take by mouth.    Marland Kitchen aspirin 81 MG EC tablet Take by mouth.    . montelukast (SINGULAIR) 10 MG tablet Take by mouth.    . Omega-3 Fatty Acids (FISH OIL) 1000 MG CAPS  Take by mouth.     No current facility-administered medications for this visit.    Past Medical History:  Diagnosis Date  . Depression   . History of rectal cancer   . Hyperlipidemia   . Thyroid disease     Past Surgical History:  Procedure Laterality Date  . LARYNGECTOMY    . WRIST SURGERY      Family History  Problem Relation Age of Onset  . Heart disease Father   no bleeding disorders, clotting disorders or aneurysms   Social History       Tobacco Use  . Smoking status: Never Smoker  . Smokeless tobacco: Never Used  Vaping Use  . Vaping Use: Never used  Substance Use Topics  . Alcohol use: Yes  . Drug use: Never     Allergies  Allergen Reactions  . Lactose     Other reaction(s): Other (See Comments), Other (See Comments)  . Sulfamethoxazole-Trimethoprim Other (See Comments)    cholestasis    REVIEW OF SYSTEMS (Negative unless checked)  Constitutional: [] ?Weight loss  [] ?Fever  [] ?Chills Cardiac: [] ?Chest pain   [] ?Chest pressure   [] ?Palpitations   [] ?Shortness of breath when laying flat   [] ?Shortness of breath at rest   [] ?Shortness of breath with exertion. Vascular:  [] ?Pain in  legs with walking   [] ?Pain in legs at rest   [] ?Pain in legs when laying flat   [] ?Claudication   [] ?Pain in feet when walking  [] ?Pain in feet at rest  [] ?Pain in feet when laying flat   [] ?History of DVT   [] ?Phlebitis   [] ?Swelling in legs   [] ?Varicose veins   [] ?Non-healing ulcers Pulmonary:   [] ?Uses home oxygen   [] ?Productive cough   [] ?Hemoptysis   [] ?Wheeze  [] ?COPD   [] ?Asthma Neurologic:  [x] ?Dizziness  [] ?Blackouts   [] ?Seizures   [] ?History of stroke   [] ?History of TIA  [] ?Aphasia   [] ?Temporary blindness   [] ?Dysphagia   [x] ?Weakness or numbness in arms   [] ?Weakness or numbness in legs Musculoskeletal:  [x] ?Arthritis   [] ?Joint swelling   [] ?Joint pain   [] ?Low back pain Hematologic:  [] ?Easy bruising  [] ?Easy bleeding   [] ?Hypercoagulable state   [] ?Anemic   [] ?Hepatitis Gastrointestinal:  [] ?Blood in stool   [] ?Vomiting blood  [] ?Gastroesophageal reflux/heartburn   [] ?Abdominal pain Genitourinary:  [] ?Chronic kidney disease   [] ?Difficult urination  [] ?Frequent urination  [] ?Burning with urination   [] ?Hematuria Skin:  [] ?Rashes   [] ?Ulcers   [] ?Wounds Psychological:  [] ?History of anxiety   [x] ? History of major depression.  Physical Examination  Vitals:   09/07/20 1610  BP: 126/83  Pulse: 83  Resp: 16  Weight: 152 lb (68.9 kg)  Height: 5\' 5"  (1.651 m)   Body mass index is 25.29 kg/m. Gen:  WD/WN, NAD Head: [place]/AT, No temporalis wasting. Ear/Nose/Throat: Hearing grossly intact, nares w/o erythema or drainage, trachea midline Eyes: Conjunctiva clear. Sclera non-icteric Neck: Supple.  Left carotid bruit. S/p laryngectomy Pulmonary:  Good air movement, equal and clear to auscultation bilaterally.  Cardiac: RRR, No JVD Vascular:  Vessel Right Left  Radial Palpable Palpable           Musculoskeletal: M/S 5/5 throughout.  No deformity or atrophy. No edema. Neurologic: CN 2-12 intact. Sensation grossly intact in extremities.  Symmetrical.  Unable to speak. Motor exam as listed above. Psychiatric: Judgment intact, Mood & affect appropriate for pt's clinical situation. Dermatologic: No rashes or ulcers noted.  No cellulitis or open wounds.      CBC No results found for: WBC, HGB, HCT, MCV, PLT  BMET No results found for: NA, K, CL, CO2, GLUCOSE, BUN, CREATININE, CALCIUM, GFRNONAA, GFRAA CrCl cannot be calculated (No successful lab value found.).  COAG No results found for: INR, PROTIME  Radiology MR ABDOMEN WWO CONTRAST  Result Date: 09/06/2020 CLINICAL DATA:  Characterize left lobe liver lesion identified by ultrasound, history of laryngeal cancer EXAM: MRI ABDOMEN WITHOUT AND WITH CONTRAST TECHNIQUE: Multiplanar multisequence MR imaging of the abdomen was performed both before and after the administration of intravenous  contrast. CONTRAST:  79mL GADAVIST GADOBUTROL 1 MMOL/ML IV SOLN COMPARISON:  Abdominal ultrasound, 08/09/2020 FINDINGS: Lower chest: No acute findings. Hepatobiliary: There is a very subtle, fluid signal lesion in the anterior left lobe of the liver measuring no greater than 7 mm (series 9, image 16), which appears to demonstrate very subtle progressive contrast enhancement (series 17, image 33). No mass or other parenchymal abnormality identified. Large gallstones in the dependent gallbladder. Pancreas: No mass, inflammatory changes, or other parenchymal abnormality identified. Spleen:  Within normal limits in size and appearance. Adrenals/Urinary Tract: No masses identified. No evidence of hydronephrosis. Stomach/Bowel: Visualized portions within the abdomen are unremarkable. Vascular/Lymphatic: No pathologically enlarged lymph nodes identified. No abdominal aortic aneurysm demonstrated. Aortic atherosclerosis. Other:  None. Musculoskeletal:  No suspicious bone lesions identified. IMPRESSION: 1. There is a very subtle, fluid signal lesion in the anterior left lobe of the liver measuring no greater than 7 mm, which appears to demonstrate very subtle progressive contrast enhancement. Although this tiny lesion is difficult to characterize, given ultrasound findings and appreciable MR characteristics this is almost certainly a tiny benign hemangioma. Metastatic disease is distinctly not favored, particularly given the absence of other suspicious findings. This could be surveilled at 6 months for stability by ultrasound if desired. 2. Cholelithiasis. Aortic Atherosclerosis (ICD10-I70.0). Electronically Signed   By: Eddie Candle M.D.   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 He also had radiation which could contribute to his carotid stenosis.  Other hyperlipidemia lipid control important in reducing the progression of atherosclerotic  disease. Continue statin therapy   Carotid stenosis Duplex today shows progression of his carotid disease now in the 40 to 59% range on the right but in the 80 to 99% range on the left.  This has progressed fairly significantly over the past several months.  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  In a patient who has already had a laryngectomy and tracheostomy with radiation to the neck, he would be a very poor candidate for open surgical repair.  We would far prefer carotid stenting in these instances.  I would like to go and start him on Plavix.  I would recommend he have this fixed in the near future at his convenience.  He wants to go home and discuss with his family the options and will call our office if he decides to have this repaired.    [name], MD  09/08/2020 10:21 AM    This note was created with Dragon medical transcription system.  Any errors from dictation are purely unintentional

## 2020-09-08 MED ORDER — CLOPIDOGREL BISULFATE 75 MG PO TABS: 75.0000 mg | ORAL_TABLET | Freq: Every day | ORAL | 6 refills | Status: DC

## 2020-09-08 NOTE — Patient Instructions (Signed)
Carotid Angioplasty With Stent Carotid angioplasty with stent is a procedure to open or widen an artery in the neck (carotid artery) that has become narrowed. This is done by inflating a small balloon inside the artery and then placing a small piece of metal that looks like a coil or spring (stent) inside the artery. The stent helps keep the artery open by supporting the artery walls. The carotid arteries supply blood to the brain. When fats, cholesterol, and other materials (plaque) build up in an artery, the artery becomes narrow and can become blocked. This can reduce or block blood flow to certain areas of the brain, which can cause serious health problems, including stroke. Tell a health care provider about:  Any allergies you have.  All medicines you are taking, including vitamins, herbs, eye drops, creams, and over-the-counter medicines.  Any problems you or family members have had with anesthetic medicines.  Any blood disorders you have.  Any surgeries you have had.  Any medical conditions you have.  Whether you are pregnant or may be pregnant. What are the risks? Generally, this is a safe procedure. However, problems may occur, including:  Infection.  Bleeding.  Allergic reactions to medicines or dyes.  Damage to other structures or organs, or to the carotid artery itself.  The carotid artery becoming blocked again.  A collection of blood under the skin (hematoma) around the stent site that gets larger.  A blood clot in another part of the body.  Kidney injury.  Stroke.  Heart attack. What happens before the procedure?  Ask your health care provider about: ? Changing or stopping your regular medicines. This is especially important if you are taking diabetes medicines or blood thinners. ? Whether aspirin is recommended before this procedure. ? Taking over-the-counter medicines, vitamins, herbs, and supplements.  Follow instructions from your health care provider  about eating or drinking restrictions.  Do not use any products that contain nicotine or tobacco for 4 weeks before the procedure. These products include cigarettes, e-cigarettes, and chewing tobacco. If you need help quitting, ask your health care provider.  Ask your health care provider what steps will be taken to help prevent infection. These may include: ? Removing hair at the surgery site. ? Washing skin with a germ-killing soap. ? Taking antibiotic medicine.  You may have blood tests and imaging tests done.  Plan to have someone take you home from the hospital or clinic.  If you will be going home right after the procedure, plan to have someone with you for 24 hours. What happens during the procedure?  An IV will be inserted into one of your veins.  You may be given one or more of the following: ? A medicine to help you relax (sedative). ? A medicine to numb the area where the catheter will be inserted (local anesthetic).  Most commonly, an incision will be made in your groin. In some cases, an incision may be made in your wrist or forearm instead of your groin.  A small, thin tube (catheter) will be inserted through your incision, into an artery. The catheter will be threaded upward into your carotid artery. An X-ray machine (fluoroscope) will help your health care provider guide the catheter to the correct place in your artery.  Dye will be injected into the catheter and will travel to the narrow or blocked part of your carotid artery.  X-ray images will be taken of how the dye flows through your artery. While the images are   being taken, you may be given instructions about breathing, swallowing, moving, or talking.  A filter (distal protection device) will be inserted into your artery. This will be used to catch plaque that comes loose in your artery during the procedure. This reduces the risk of plaque moving into your brain.  A small balloon will be inserted into your  artery. The balloon will be inflated for a few seconds to widen your artery and will then be removed.  The stent will be placed in your artery.  A second small balloon will be inserted into your artery and inflated. This expands the stent inside of your artery so that the stent holds up the artery walls. The balloon will then be removed.  The catheter and the distal protection device will be removed from your artery.  Your incision may be closed with stitches (sutures), skin glue, or adhesive tape.  A bandage (dressing) will be placed over your incision. The procedure may vary among health care providers and hospitals.   What happens after the procedure?  Your blood pressure, heart rate, breathing rate, and blood oxygen level will be monitored until you leave the hospital or clinic.  You may continue to receive fluids and medicines through an IV.  You may need to have pressure placed on the incision site to prevent bleeding.  You will need to keep the area still for a few hours, or as long as directed by your health care provider. If the procedure was done in the groin, you will be instructed not to bend or cross your legs.  You may have some pain. Pain medicines will be available to help you.  You may have a test that uses sound waves to take pictures (ultrasound) of the carotid artery. This can be compared to future tests to check for changes in the artery.  Do not drive for 24 hours. Summary  Carotid angioplasty with stent is a procedure to open or widen an artery in the neck (carotid artery) that has become narrowed.  The procedure is done to lower the risk of problems that can result from reduced blood flow to the brain, including a stroke.  The stent placed inside the artery will help keep the artery open by supporting the artery walls.  Follow instructions from your health care provider about taking medicines and about eating and drinking before the procedure. This  information is not intended to replace advice given to you by your health care provider. Make sure you discuss any questions you have with your health care provider. Document Revised: 04/02/2018 Document Reviewed: 03/28/2018 [employer] Patient Education  2021 [employer]  

## 2020-09-08 NOTE — Assessment & Plan Note (Signed)
Duplex today shows progression of his carotid disease now in the 40 to 59% range on the right but in the 80 to 99% range on the left.  This has progressed fairly significantly over the past several months.  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  In a patient who has already had a laryngectomy and tracheostomy with radiation to the neck, he would be a very poor candidate for open surgical repair.  We would far prefer carotid stenting in these instances.  I would like to go and start him on Plavix.  I would recommend he have this fixed in the near future at his convenience.  He wants to go home and discuss with his family the options and will call our office if he decides to have this repaired.

## 2020-09-16 ENCOUNTER — Other Ambulatory Visit: Payer: Self-pay

## 2020-09-16 ENCOUNTER — Other Ambulatory Visit
Admission: RE | Admit: 2020-09-16 | Discharge: 2020-09-16 | Disposition: A | Discharge status: Home / Self Care | Payer: Medicare Other | Source: Ambulatory Visit | Attending: Vascular Surgery | Admitting: Vascular Surgery

## 2020-09-16 DIAGNOSIS — Z01812 Encounter for preprocedural laboratory examination: Secondary | ICD-10-CM | POA: Diagnosis present

## 2020-09-16 DIAGNOSIS — Z20822 Contact with and (suspected) exposure to covid-19: Secondary | ICD-10-CM | POA: Insufficient documentation

## 2020-09-16 LAB — SARS CORONAVIRUS 2 (TAT 6-24 HRS): SARS Coronavirus 2: NEGATIVE

## 2020-09-19 ENCOUNTER — Other Ambulatory Visit (INDEPENDENT_AMBULATORY_CARE_PROVIDER_SITE_OTHER): Payer: Self-pay | Admitting: Nurse Practitioner

## 2020-09-20 ENCOUNTER — Other Ambulatory Visit: Payer: Self-pay

## 2020-09-20 ENCOUNTER — Inpatient Hospital Stay
Admission: RE | Admit: 2020-09-20 | Discharge: 2020-09-21 | DRG: 036 | Disposition: A | Discharge status: Home / Self Care | Payer: Medicare Other | Attending: Vascular Surgery | Admitting: Vascular Surgery

## 2020-09-20 ENCOUNTER — Encounter: Payer: Self-pay | Admitting: Vascular Surgery

## 2020-09-20 ENCOUNTER — Encounter
Admission: RE | Disposition: A | Discharge status: Home / Self Care | Payer: Self-pay | Source: Home / Self Care | Attending: Vascular Surgery

## 2020-09-20 DIAGNOSIS — I70208 Unspecified atherosclerosis of native arteries of extremities, other extremity: Secondary | ICD-10-CM | POA: Diagnosis present

## 2020-09-20 DIAGNOSIS — Z882 Allergy status to sulfonamides status: Secondary | ICD-10-CM

## 2020-09-20 DIAGNOSIS — Y842 Radiological procedure and radiotherapy as the cause of abnormal reaction of the patient, or of later complication, without mention of misadventure at the time of the procedure: Secondary | ICD-10-CM | POA: Diagnosis present

## 2020-09-20 DIAGNOSIS — Z7902 Long term (current) use of antithrombotics/antiplatelets: Secondary | ICD-10-CM

## 2020-09-20 DIAGNOSIS — Z8521 Personal history of malignant neoplasm of larynx: Secondary | ICD-10-CM

## 2020-09-20 DIAGNOSIS — Z79899 Other long term (current) drug therapy: Secondary | ICD-10-CM

## 2020-09-20 DIAGNOSIS — F32A Depression, unspecified: Secondary | ICD-10-CM | POA: Diagnosis present

## 2020-09-20 DIAGNOSIS — Z7982 Long term (current) use of aspirin: Secondary | ICD-10-CM

## 2020-09-20 DIAGNOSIS — I6522 Occlusion and stenosis of left carotid artery: Secondary | ICD-10-CM | POA: Diagnosis present

## 2020-09-20 DIAGNOSIS — Z923 Personal history of irradiation: Secondary | ICD-10-CM | POA: Diagnosis not present

## 2020-09-20 DIAGNOSIS — Z8249 Family history of ischemic heart disease and other diseases of the circulatory system: Secondary | ICD-10-CM | POA: Diagnosis not present

## 2020-09-20 DIAGNOSIS — Z85048 Personal history of other malignant neoplasm of rectum, rectosigmoid junction, and anus: Secondary | ICD-10-CM

## 2020-09-20 DIAGNOSIS — Z79890 Hormone replacement therapy (postmenopausal): Secondary | ICD-10-CM | POA: Diagnosis not present

## 2020-09-20 DIAGNOSIS — E7849 Other hyperlipidemia: Secondary | ICD-10-CM | POA: Diagnosis present

## 2020-09-20 DIAGNOSIS — Z8589 Personal history of malignant neoplasm of other organs and systems: Secondary | ICD-10-CM

## 2020-09-20 HISTORY — DX: Malignant (primary) neoplasm, unspecified: C80.1

## 2020-09-20 HISTORY — PX: CAROTID PTA/STENT INTERVENTION: CATH118231

## 2020-09-20 LAB — MRSA PCR SCREENING: MRSA by PCR: NEGATIVE

## 2020-09-20 LAB — POCT ACTIVATED CLOTTING TIME: Activated Clotting Time: 285 seconds

## 2020-09-20 SURGERY — CAROTID PTA/STENT INTERVENTION
Anesthesia: Moderate Sedation | Laterality: Left

## 2020-09-20 MED ORDER — PHENYLEPHRINE HCL (PRESSORS) 10 MG/ML IV SOLN
INTRAVENOUS | Status: AC
  Filled 2020-09-20: qty 1

## 2020-09-20 MED ORDER — CEFAZOLIN SODIUM-DEXTROSE 2-4 GM/100ML-% IV SOLN
INTRAVENOUS | Status: AC
  Administered 2020-09-20: 2 g via INTRAVENOUS
  Filled 2020-09-20: qty 100

## 2020-09-20 MED ORDER — ALUM & MAG HYDROXIDE-SIMETH 200-200-20 MG/5ML PO SUSP: 15.0000 mL | ORAL | Status: DC | PRN

## 2020-09-20 MED ORDER — ASPIRIN EC 81 MG PO TBEC: 81.0000 mg | DELAYED_RELEASE_TABLET | Freq: Every day | ORAL | Status: DC

## 2020-09-20 MED ORDER — MAGNESIUM SULFATE 2 GM/50ML IV SOLN
2.0000 g | Freq: Every day | INTRAVENOUS | Status: DC | PRN
  Filled 2020-09-20: qty 50

## 2020-09-20 MED ORDER — OMEGA-3-ACID ETHYL ESTERS 1 G PO CAPS
1.0000 g | ORAL_CAPSULE | Freq: Every day | ORAL | Status: DC
  Administered 2020-09-21: 1 g via ORAL
  Filled 2020-09-20: qty 1

## 2020-09-20 MED ORDER — FAMOTIDINE IN NACL 20-0.9 MG/50ML-% IV SOLN
20.0000 mg | Freq: Two times a day (BID) | INTRAVENOUS | Status: DC
  Administered 2020-09-20 – 2020-09-21 (×3): 20 mg via INTRAVENOUS
  Filled 2020-09-20 (×3): qty 50

## 2020-09-20 MED ORDER — ATROPINE SULFATE 1 MG/10ML IJ SOSY
PREFILLED_SYRINGE | INTRAMUSCULAR | Status: AC
  Filled 2020-09-20: qty 20

## 2020-09-20 MED ORDER — LABETALOL HCL 5 MG/ML IV SOLN
10.0000 mg | INTRAVENOUS | Status: DC | PRN
Start: 2020-09-20 — End: 2020-09-21

## 2020-09-20 MED ORDER — METOPROLOL TARTRATE 5 MG/5ML IV SOLN: 2.0000 mg | INTRAVENOUS | Status: DC | PRN

## 2020-09-20 MED ORDER — IBUPROFEN 400 MG PO TABS
400.0000 mg | ORAL_TABLET | Freq: Four times a day (QID) | ORAL | Status: DC | PRN
  Filled 2020-09-20: qty 1

## 2020-09-20 MED ORDER — TRAMADOL HCL 50 MG PO TABS: 50.0000 mg | ORAL_TABLET | Freq: Four times a day (QID) | ORAL | Status: DC | PRN

## 2020-09-20 MED ORDER — MIDAZOLAM HCL 2 MG/ML PO SYRP: 8.0000 mg | ORAL_SOLUTION | Freq: Once | ORAL | Status: DC | PRN

## 2020-09-20 MED ORDER — SODIUM CHLORIDE 0.9 % IV SOLN: 500.0000 mL | Freq: Once | INTRAVENOUS | Status: DC | PRN

## 2020-09-20 MED ORDER — MIDAZOLAM HCL 5 MG/5ML IJ SOLN
INTRAMUSCULAR | Status: AC
  Filled 2020-09-20: qty 5

## 2020-09-20 MED ORDER — SODIUM CHLORIDE 0.9 % IV SOLN: INTRAVENOUS | Status: AC

## 2020-09-20 MED ORDER — CLOPIDOGREL BISULFATE 75 MG PO TABS: 75.0000 mg | ORAL_TABLET | Freq: Every day | ORAL | Status: DC

## 2020-09-20 MED ORDER — SODIUM CHLORIDE 0.9 % IV SOLN: INTRAVENOUS | Status: DC

## 2020-09-20 MED ORDER — HYDROMORPHONE HCL 1 MG/ML IJ SOLN: 1.0000 mg | Freq: Once | INTRAMUSCULAR | Status: DC | PRN

## 2020-09-20 MED ORDER — ONDANSETRON HCL 4 MG/2ML IJ SOLN: 4.0000 mg | Freq: Four times a day (QID) | INTRAMUSCULAR | Status: DC | PRN

## 2020-09-20 MED ORDER — CLOPIDOGREL BISULFATE 75 MG PO TABS
75.0000 mg | ORAL_TABLET | Freq: Every day | ORAL | Status: DC
  Administered 2020-09-20 – 2020-09-21 (×2): 75 mg via ORAL
  Filled 2020-09-20 (×2): qty 1

## 2020-09-20 MED ORDER — POTASSIUM CHLORIDE CRYS ER 20 MEQ PO TBCR: 20.0000 meq | EXTENDED_RELEASE_TABLET | Freq: Every day | ORAL | Status: DC | PRN

## 2020-09-20 MED ORDER — MONTELUKAST SODIUM 10 MG PO TABS
5.0000 mg | ORAL_TABLET | Freq: Every day | ORAL | Status: DC
  Filled 2020-09-20: qty 1
  Filled 2020-09-20: qty 0.5

## 2020-09-20 MED ORDER — PHENOL 1.4 % MT LIQD
1.0000 | OROMUCOSAL | Status: DC | PRN
  Filled 2020-09-20: qty 177

## 2020-09-20 MED ORDER — HEPARIN SODIUM (PORCINE) 1000 UNIT/ML IJ SOLN
INTRAMUSCULAR | Status: AC
  Filled 2020-09-20: qty 1

## 2020-09-20 MED ORDER — HYDRALAZINE HCL 20 MG/ML IJ SOLN
5.0000 mg | INTRAMUSCULAR | Status: DC | PRN
Start: 2020-09-20 — End: 2020-09-21

## 2020-09-20 MED ORDER — FENTANYL CITRATE (PF) 100 MCG/2ML IJ SOLN
INTRAMUSCULAR | Status: AC
  Filled 2020-09-20: qty 2

## 2020-09-20 MED ORDER — IODIXANOL 320 MG/ML IV SOLN
INTRAVENOUS | Status: DC | PRN
  Administered 2020-09-20: 55 mL via INTRA_ARTERIAL

## 2020-09-20 MED ORDER — MIDAZOLAM HCL 2 MG/2ML IJ SOLN
INTRAMUSCULAR | Status: DC | PRN
  Administered 2020-09-20: 2 mg via INTRAVENOUS

## 2020-09-20 MED ORDER — CEFAZOLIN SODIUM-DEXTROSE 2-4 GM/100ML-% IV SOLN: 2.0000 g | Freq: Once | INTRAVENOUS | Status: AC

## 2020-09-20 MED ORDER — ATROPINE SULFATE 1 MG/10ML IJ SOSY
PREFILLED_SYRINGE | INTRAMUSCULAR | Status: DC | PRN
  Administered 2020-09-20: 1 mg via INTRAVENOUS

## 2020-09-20 MED ORDER — FAMOTIDINE 20 MG PO TABS: 40.0000 mg | ORAL_TABLET | Freq: Once | ORAL | Status: DC | PRN

## 2020-09-20 MED ORDER — OXYCODONE-ACETAMINOPHEN 5-325 MG PO TABS: 1.0000 | ORAL_TABLET | ORAL | Status: DC | PRN

## 2020-09-20 MED ORDER — ACETAMINOPHEN 325 MG PO TABS
325.0000 mg | ORAL_TABLET | ORAL | Status: DC | PRN
Start: 2020-09-20 — End: 2020-09-21

## 2020-09-20 MED ORDER — DOPAMINE-DEXTROSE 3.2-5 MG/ML-% IV SOLN
INTRAVENOUS | Status: AC
  Filled 2020-09-20: qty 250

## 2020-09-20 MED ORDER — CHLORHEXIDINE GLUCONATE CLOTH 2 % EX PADS
6.0000 | MEDICATED_PAD | Freq: Every day | CUTANEOUS | Status: DC
  Administered 2020-09-20: 6 via TOPICAL

## 2020-09-20 MED ORDER — MORPHINE SULFATE (PF) 4 MG/ML IV SOLN: 2.0000 mg | INTRAVENOUS | Status: DC | PRN

## 2020-09-20 MED ORDER — ORAL CARE MOUTH RINSE: 15.0000 mL | Freq: Two times a day (BID) | OROMUCOSAL | Status: DC

## 2020-09-20 MED ORDER — LEVOTHYROXINE SODIUM 50 MCG PO TABS
50.0000 ug | ORAL_TABLET | Freq: Every day | ORAL | Status: DC
  Administered 2020-09-21: 50 ug via ORAL
  Filled 2020-09-20 (×3): qty 1

## 2020-09-20 MED ORDER — ASPIRIN EC 81 MG PO TBEC
81.0000 mg | DELAYED_RELEASE_TABLET | Freq: Every day | ORAL | Status: DC
  Administered 2020-09-21: 81 mg via ORAL
  Filled 2020-09-20: qty 1

## 2020-09-20 MED ORDER — HEPARIN SODIUM (PORCINE) 1000 UNIT/ML IJ SOLN
INTRAMUSCULAR | Status: DC | PRN
  Administered 2020-09-20: 7000 [IU] via INTRAVENOUS

## 2020-09-20 MED ORDER — DIPHENHYDRAMINE HCL 50 MG/ML IJ SOLN: 50.0000 mg | Freq: Once | INTRAMUSCULAR | Status: DC | PRN

## 2020-09-20 MED ORDER — FENTANYL CITRATE (PF) 100 MCG/2ML IJ SOLN
INTRAMUSCULAR | Status: DC | PRN
  Administered 2020-09-20: 50 ug via INTRAVENOUS

## 2020-09-20 MED ORDER — GUAIFENESIN-DM 100-10 MG/5ML PO SYRP
15.0000 mL | ORAL_SOLUTION | ORAL | Status: DC | PRN
  Filled 2020-09-20: qty 15

## 2020-09-20 MED ORDER — ACETAMINOPHEN 325 MG RE SUPP
325.0000 mg | RECTAL | Status: DC | PRN
Start: 2020-09-20 — End: 2020-09-21
  Filled 2020-09-20: qty 2

## 2020-09-20 MED ORDER — ACETAMINOPHEN 500 MG PO TABS: 500.0000 mg | ORAL_TABLET | Freq: Four times a day (QID) | ORAL | Status: DC | PRN

## 2020-09-20 MED ORDER — METHYLPREDNISOLONE SODIUM SUCC 125 MG IJ SOLR: 125.0000 mg | Freq: Once | INTRAMUSCULAR | Status: DC | PRN

## 2020-09-20 MED ORDER — ATORVASTATIN CALCIUM 10 MG PO TABS
10.0000 mg | ORAL_TABLET | Freq: Every day | ORAL | Status: DC
  Administered 2020-09-20 – 2020-09-21 (×2): 10 mg via ORAL
  Filled 2020-09-20 (×3): qty 1

## 2020-09-20 MED ORDER — CEFAZOLIN SODIUM-DEXTROSE 2-4 GM/100ML-% IV SOLN
2.0000 g | Freq: Three times a day (TID) | INTRAVENOUS | Status: AC
  Administered 2020-09-20 – 2020-09-21 (×2): 2 g via INTRAVENOUS
  Filled 2020-09-20 (×2): qty 100

## 2020-09-20 SURGICAL SUPPLY — 19 items
BALLN VTRAC 4.5X20X135 (BALLOONS) ×2
BALLOON VTRAC 4.5X20X135 (BALLOONS) ×1 IMPLANT
CATH ANGIO 5F PIGTAIL 100CM (CATHETERS) ×2 IMPLANT
CATH HEADHUNTER H1 5F 100CM (CATHETERS) ×2 IMPLANT
COVER PROBE U/S 5X48 (MISCELLANEOUS) ×2 IMPLANT
DEVICE EMBOSHIELD NAV6 4.0-7.0 (FILTER) ×2 IMPLANT
DEVICE SAFEGUARD 24CM (GAUZE/BANDAGES/DRESSINGS) ×2 IMPLANT
DEVICE STARCLOSE SE CLOSURE (Vascular Products) ×2 IMPLANT
DEVICE TORQUE (MISCELLANEOUS) ×2 IMPLANT
GLIDEWIRE ANGLED SS 035X260CM (WIRE) ×2 IMPLANT
KIT CAROTID MANIFOLD (MISCELLANEOUS) ×4 IMPLANT
KIT ENCORE 26 ADVANTAGE (KITS) ×2 IMPLANT
PACK ANGIOGRAPHY (CUSTOM PROCEDURE TRAY) ×2 IMPLANT
SHEATH BRITE TIP 5FRX11 (SHEATH) ×2 IMPLANT
SHEATH SHUTTLE 6FR (SHEATH) ×2 IMPLANT
STENT XACT CAR 8-6X30X136 (Permanent Stent) ×2 IMPLANT
SYR MEDRAD MARK 7 150ML (SYRINGE) ×2 IMPLANT
WIRE G VAS 035X260 STIFF (WIRE) ×2 IMPLANT
WIRE GUIDERIGHT .035X150 (WIRE) ×2 IMPLANT

## 2020-09-20 NOTE — Interval H&P Note (Signed)
History and Physical Interval Note:  09/20/2020 11:55 AM  Ryan Vazquez  has presented today for surgery, with the diagnosis of LT Carotid Stent Placement   LT Carotid Artery Stenosis   ABBOTT Rep cc: S Willey   Pt to have Covid test on 3-31.  The various methods of treatment have been discussed with the patient and family. After consideration of risks, benefits and other options for treatment, the patient has consented to  Procedure(s): CAROTID PTA/STENT INTERVENTION (Left) as a surgical intervention.  The patient's history has been reviewed, patient examined, no change in status, stable for surgery.  I have reviewed the patient's chart and labs.  Questions were answered to the patient's satisfaction.     [name]

## 2020-09-20 NOTE — Op Note (Signed)
OPERATIVE NOTE DATE: 09/20/2020  PROCEDURE: 1.  Ultrasound guidance for vascular access right femoral artery 2.  Placement of a 8 mm proximal, 6 mm distal, 3 cm long Exact stent with the use of the NAV-6 embolic protection device in the left internal carotid artery  PRE-OPERATIVE DIAGNOSIS: 1. High grade left carotid artery stenosis. 2.  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   Greater than 95% left internal carotid artery stenosis 2.  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 male who presents with high-grade left carotid artery stenosis.  The patient has had previous head neck cancer with laryngectomy and radiation and carotid artery stenting was felt to be preferred to endarterectomy for that reason.  Risks and benefits were discussed and informed consent was obtained.   DESCRIPTION: After obtaining full informed written consent, the patient was brought back to the vascular suite and placed supine upon the table.  The patient received IV antibiotics prior to induction. Moderate conscious sedation was administered during a face to face encounter with the patient throughout the procedure with my supervision of the RN administering medicines and monitoring the patients vital signs and mental status throughout from the start of the procedure until the patient was taken to the recovery room.  After obtaining adequate anesthesia, the patient was prepped and draped in the standard fashion.   The right femoral artery was visualized with ultrasound  and found to be widely patent. It was then accessed under direct ultrasound guidance without difficulty with a Seldinger needle. A permanent image was recorded. A J-wire was placed and we then placed a 6 French sheath. The patient was then heparinized and a total of 7000 units of intravenous heparin were given and an ACT was checked to confirm successful anticoagulation. A pigtail catheter was then placed into the ascending aorta. This showed a type II aortic arch with a highly calcific innominate artery stenosis that appeared to be greater than 75%.  The proximal left common carotid artery and subclavian artery were widely patent. I then selectively cannulated the left common carotid artery without difficulty with a headhunter catheter and advanced into the mid left common carotid artery.  Cervical and cerebral carotid angiography was then performed. There were no obvious intracranial filling defects with no anterior cerebral filling but good middle cerebral filling. The carotid bifurcation demonstrated a lesion about 2 cm above the carotid bifurcation which was highly stenotic of greater than 95%.  I then advanced into the external carotid artery with a Glidewire and the headhunter catheter and then exchanged for the Amplatz Super Stiff wire. Over the Amplatz Super Stiff wire, a 6 [place] shuttle sheath was placed into the mid common carotid artery. I then used the NAV-6  Embolic protection device and crossed the lesion and parked this in the distal internal carotid artery at the base of the skull.  I then selected a 8 mm proximal, 6 mm distal, 3 cm long exact stent. This was deployed across the lesion encompassing it in its entirety with the  stent entirely in the internal carotid artery. A 4.5 mm diameter by 2 cm length balloon was used to post dilate the stent. Only about a 15% residual stenosis was present after angioplasty. Completion angiogram showed normal intracranial filling without new defects. At this  point I elected to terminate the procedure. The sheath was removed and StarClose closure device was deployed in the right femoral artery with excellent hemostatic result. The patient was taken to the recovery room in stable condition having tolerated the procedure well.  COMPLICATIONS: none  CONDITION: stable  [name] 09/20/2020 1:56 PM   This note was created with Dragon Medical transcription system. Any errors in dictation are purely unintentional.

## 2020-09-20 NOTE — Plan of Care (Addendum)
Patient admitted to CCU from OR s/p Left Carotid Stent. AA+Ox4. No acute distress.   Problem: Education: Goal: Knowledge of General Education information will improve Description: Including pain rating scale, medication(s)/side effects and non-pharmacologic comfort measures Outcome: Progressing   Problem: Health Behavior/Discharge Planning: Goal: Ability to manage health-related needs will improve Outcome: Progressing   Problem: Clinical Measurements: Goal: Ability to maintain clinical measurements within normal limits will improve Outcome: Progressing Goal: Will remain free from infection Outcome: Progressing Goal: Diagnostic test results will improve Outcome: Progressing Goal: Respiratory complications will improve Outcome: Progressing Goal: Cardiovascular complication will be avoided Outcome: Progressing   Problem: Activity: Goal: Risk for activity intolerance will decrease Outcome: Progressing   Problem: Nutrition: Goal: Adequate nutrition will be maintained Outcome: Progressing   Problem: Coping: Goal: Level of anxiety will decrease Outcome: Progressing   Problem: Elimination: Goal: Will not experience complications related to bowel motility Outcome: Progressing Goal: Will not experience complications related to urinary retention Outcome: Progressing   Problem: Pain Managment: Goal: General experience of comfort will improve Outcome: Progressing   Problem: Safety: Goal: Ability to remain free from injury will improve Outcome: Progressing   Problem: Skin Integrity: Goal: Risk for impaired skin integrity will decrease Outcome: Progressing

## 2020-09-20 NOTE — Interval H&P Note (Signed)
History and Physical Interval Note:  09/20/2020 11:54 AM  Ryan Vazquez  has presented today for surgery, with the diagnosis of LT Carotid Stent Placement   LT Carotid Artery Stenosis   ABBOTT Rep cc: S Willey   Pt to have Covid test on 3-31.  The various methods of treatment have been discussed with the patient and family. After consideration of risks, benefits and other options for treatment, the patient has consented to  Procedure(s): CAROTID PTA/STENT INTERVENTION (Left) as a surgical intervention.  The patient's history has been reviewed, patient examined, no change in status, stable for surgery.  I have reviewed the patient's chart and labs.  Questions were answered to the patient's satisfaction.     [name]

## 2020-09-21 ENCOUNTER — Encounter: Payer: Self-pay | Admitting: Vascular Surgery

## 2020-09-21 DIAGNOSIS — I6522 Occlusion and stenosis of left carotid artery: Principal | ICD-10-CM

## 2020-09-21 LAB — CBC
HCT: 39.8 % (ref 39.0–52.0)
Hemoglobin: 13 g/dL (ref 13.0–17.0)
MCH: 31 pg (ref 26.0–34.0)
MCHC: 32.7 g/dL (ref 30.0–36.0)
MCV: 95 fL (ref 80.0–100.0)
Platelets: 209 10*3/uL (ref 150–400)
RBC: 4.19 MIL/uL — ABNORMAL LOW (ref 4.22–5.81)
RDW: 12.8 % (ref 11.5–15.5)
WBC: 9.4 10*3/uL (ref 4.0–10.5)
nRBC: 0 % (ref 0.0–0.2)

## 2020-09-21 LAB — BASIC METABOLIC PANEL
Anion gap: 7 (ref 5–15)
BUN: 20 mg/dL (ref 8–23)
CO2: 28 mmol/L (ref 22–32)
Calcium: 8.5 mg/dL — ABNORMAL LOW (ref 8.9–10.3)
Chloride: 100 mmol/L (ref 98–111)
Creatinine, Ser: 0.78 mg/dL (ref 0.61–1.24)
GFR, Estimated: >60 mL/min (ref >60)
Glucose, Bld: 104 mg/dL — ABNORMAL HIGH (ref 70–99)
Potassium: 4.2 mmol/L (ref 3.5–5.1)
Sodium: 135 mmol/L (ref 135–145)

## 2020-09-21 NOTE — Discharge Summary (Signed)
[facility]    Discharge Summary  Patient ID:  Ryan Vazquez MRN: [id number] DOB/AGE: 1934/08/23 86 y.o.  Admit date: 09/20/2020 Discharge date: 09/21/2020 Date of Surgery: 09/20/2020 Surgeon: Surgeon(s): Algernon Huxley, MD  Admission Diagnosis: Carotid stenosis, left [I65.22]  Discharge Diagnoses:  Carotid stenosis, left [I65.22]  Secondary Diagnoses: Past Medical History:  Diagnosis Date  . Cancer (HCC)    larynx  . Depression   . History of rectal cancer   . Hyperlipidemia   . Thyroid disease    Procedure(s): 09/20/20: 1.  Ultrasound guidance for vascular access right femoral artery 2.  Placement of a 8 mm proximal, 6 mm distal, 3 cm long Exact stent with the use of the NAV-6 embolic protection device in the left internal carotid artery  Discharged Condition: Good  HPI / Hospital Course:  Patient is a 86 y.o. male who presents with high-grade left carotid artery stenosis.  The patient has had previous head neck cancer with laryngectomy and radiation and carotid artery stenting was felt to be preferred to endarterectomy for that reason. On 09/20/20, the patient underwent:  3.  Ultrasound guidance for vascular access right femoral artery 4.  Placement of a 8 mm proximal, 6 mm distal, 3 cm long Exact stent with the use of the NAV-6 embolic protection device in the left internal carotid artery  The patient tolerated the procedure well was transferred from the angiography suite to the ICU for observation overnight.  The patient's night of surgery was unremarkable. During his brief inpatient stay, the patient's diet was advanced, was urinating independently at discharge, her pain was controlled to the use of pain medication she was ambulating at baseline.  Day of discharge, the patient was afebrile with stable vital signs and improved physical exam.  Physical Exam:  Alert and oriented x3, no acute distress Face: Symmetrical, tongue midline Neck:  Trachea midline, no areas of swelling or ecchymosis Cardiovascular: Regular rate and rhythm Pulmonary: Clear to auscultation bilaterally Abdomen: Soft, nontender, nondistended positive bowel sounds Extremity: Warm distally toes minimal edema Neurological: Intact, no deficits on today's exam  Labs: As below  Complications: None  Consults: None  Significant Diagnostic Studies: CBC Lab Results  Component Value Date   WBC 9.4 09/21/2020   HGB 13.0 09/21/2020   HCT 39.8 09/21/2020   MCV 95.0 09/21/2020   PLT 209 09/21/2020   BMET    Component Value Date/Time   NA 135 09/21/2020 0413   K 4.2 09/21/2020 0413   CL 100 09/21/2020 0413   CO2 28 09/21/2020 0413   GLUCOSE 104 (H) 09/21/2020 0413   BUN 20 09/21/2020 0413   CREATININE 0.78 09/21/2020 0413   CALCIUM 8.5 (L) 09/21/2020 0413   GFRNONAA >60 09/21/2020 0413   COAG No results found for: INR, PROTIME  Disposition:  Discharge to :Home  Allergies as of 09/21/2020      Reactions   Lactose    Other reaction(s): Other (See Comments), Other (See Comments)   Sulfamethoxazole-trimethoprim Other (See Comments)   cholestasis      Medication List    TAKE these medications   acetaminophen 325 MG tablet Commonly known as: TYLENOL Take by mouth.   aspirin 81 MG EC tablet Take by mouth.   atorvastatin 10 MG tablet Commonly known as: Lipitor Take 1 tablet (10 mg total) by mouth daily.   B COMPLEX 1 PO Take by mouth.   calcium carbonate 100 mg/ml Susp Take by mouth.   CENTRUM SILVER  50+MEN PO Take by mouth.   clopidogrel 75 MG tablet Commonly known as: PLAVIX Take 1 tablet (75 mg total) by mouth daily.   fexofenadine 180 MG tablet Commonly known as: ALLEGRA Take by mouth.   Fish Oil 1000 MG Caps Take by mouth.   Omega-3 1000 MG Caps Take by mouth.   ibuprofen 200 MG tablet Commonly known as: ADVIL Take by mouth.   levothyroxine 50 MCG tablet Commonly known as: SYNTHROID TAKE 1 TABLET ON AN EMPTY  STOMACH WITH A GLASS OF WATER AT LEAST 30-60 MINUTES BEFORE BREAKFAST.   montelukast 10 MG tablet Commonly known as: SINGULAIR Take by mouth.      Verbal and written Discharge instructions given to the patient. Wound care per Discharge AVS  Follow-up Information    Dew, Erskine Squibb, MD Follow up in 1 month(s).   Specialties: Vascular Surgery, Radiology, Interventional Cardiology Why: Can see Dew or Arna Medici.  Will need carotid duplex with visit. Contact information: [place] [address] [phone]        Algernon Huxley, MD In 1 month.   Specialties: Vascular Surgery, Radiology, Interventional Cardiology Why: MAY [phone] 1PM FOR CAROTID DUPLEX AND 2PM WITH [facility] Contact information: [place] [address] [phone]              Signed: Sela Hua, PA-C  09/21/2020, 4:34 PM

## 2020-09-21 NOTE — Progress Notes (Signed)
Stegmayer, [place] spoke with patient's daughter and then spoke with this RN. PA instructed nurse to get patient up to chair and then walk him and that we would reassess whether or not to discharge patient around 3:30 pm.

## 2020-09-21 NOTE — Progress Notes (Signed)
Discharge instructions given to and reviewed with patient's daughter Jaryn Rosko. Daughter verbalized understanding of all discharge instructions including medications, follow up appointment and activity.  Patient left ICU alert, by wheelchair with daughter and volunteer. Daughter taking patient home.

## 2020-09-21 NOTE — Progress Notes (Signed)
Spoke with [employer], PA and made her aware that when patient got up and ambulated that groin site began to bleed to the extent blood ran down his leg. PA instructed RN to wait and attempt to ambulate again in 1 hour and if bleeding reoccurs then to let her know.

## 2020-09-21 NOTE — Progress Notes (Signed)
Messaged Stegmayer, PA to make her aware that right groin site continues to ooze with patient at rest.

## 2020-09-21 NOTE — Progress Notes (Addendum)
Secure chat sent to Dr. Lucky Cowboy and Marcelle Overlie, NP and made them both aware that patient is seeing insects on the wall and now he is itching and that this all started after the groin site was injected with lidocaine and stitch placed. Patient oriented to self, place and situation.  PEARLA and no other neuro deficits noted. PA to call and speak with daughter. Dr. Lucky Cowboy instructed nurse in secure chat that patient may be starting to experience ICU delirium and it would be best for patient to discharge home.

## 2020-09-21 NOTE — Discharge Instructions (Signed)
You may shower as of tomorrow.  Please keep your groins clean and dry. Please do not engage in strenuous activity or lifting greater than 10 pounds until you are cleared at your first postoperative visit.

## 2020-09-21 NOTE — Progress Notes (Signed)
Stegmayer, PA came to bedside. Patient A&Ox4. No further itching. Continues to see insects on wall. Ambulated around ICU and tolerated well. Right groin site has had no further bleeding since stitch placed and is free of complications. Bilateral pedal pulses dopplered.  PA instructed nurse to discharge patient and spoke with daughter who is at bedside.

## 2020-09-22 ENCOUNTER — Encounter: Payer: Self-pay | Admitting: Vascular Surgery

## 2020-10-20 ENCOUNTER — Other Ambulatory Visit (INDEPENDENT_AMBULATORY_CARE_PROVIDER_SITE_OTHER): Payer: Self-pay | Admitting: Vascular Surgery

## 2020-10-20 DIAGNOSIS — Z959 Presence of cardiac and vascular implant and graft, unspecified: Secondary | ICD-10-CM

## 2020-10-20 DIAGNOSIS — I6522 Occlusion and stenosis of left carotid artery: Secondary | ICD-10-CM

## 2020-10-21 ENCOUNTER — Ambulatory Visit (INDEPENDENT_AMBULATORY_CARE_PROVIDER_SITE_OTHER): Payer: Medicare Other | Admitting: Nurse Practitioner

## 2020-10-21 ENCOUNTER — Encounter (INDEPENDENT_AMBULATORY_CARE_PROVIDER_SITE_OTHER): Payer: Self-pay | Admitting: Nurse Practitioner

## 2020-10-21 ENCOUNTER — Encounter (INDEPENDENT_AMBULATORY_CARE_PROVIDER_SITE_OTHER): Payer: Medicare Other

## 2020-10-21 ENCOUNTER — Ambulatory Visit (INDEPENDENT_AMBULATORY_CARE_PROVIDER_SITE_OTHER): Payer: Medicare Other

## 2020-10-21 ENCOUNTER — Other Ambulatory Visit: Payer: Self-pay

## 2020-10-21 VITALS — BP 165/92 | HR 73 | Resp 17 | Ht 64.0 in | Wt 152.0 lb

## 2020-10-21 DIAGNOSIS — Z959 Presence of cardiac and vascular implant and graft, unspecified: Secondary | ICD-10-CM | POA: Diagnosis not present

## 2020-10-21 DIAGNOSIS — E7849 Other hyperlipidemia: Secondary | ICD-10-CM

## 2020-10-21 DIAGNOSIS — I6522 Occlusion and stenosis of left carotid artery: Secondary | ICD-10-CM | POA: Diagnosis not present

## 2020-10-25 ENCOUNTER — Encounter (INDEPENDENT_AMBULATORY_CARE_PROVIDER_SITE_OTHER): Payer: Self-pay | Admitting: Nurse Practitioner

## 2020-10-25 NOTE — Progress Notes (Signed)
Subjective:    Patient ID: Ryan Vazquez, male    DOB: 1935-01-11, 86 y.o.   MRN: [id number] Chief Complaint  Patient presents with  . Follow-up    ultrasound    The patient is seen for follow up evaluation of carotid stenosis status post left carotid stent on 09/07/2020.  There were no post operative problems or complications related to the surgery.  The patient denies neck or incisional pain.  The patient denies interval amaurosis fugax. There is no recent history of TIA symptoms or focal motor deficits. There is no prior documented CVA.  The patient denies headache.  The patient is taking enteric-coated aspirin 81 mg daily.  The patient has a history of coronary artery disease, no recent episodes of angina or shortness of breath. The patient denies PAD or claudication symptoms. There is a history of hyperlipidemia which is being treated with a statin.   Today noninvasive studies show 1 to 39% stenosis bilaterally.  The left ICA is patent with no evidence of restenosis.  Normal flow hemodynamics in the bilateral subclavian's with antegrade flow in the bilateral vertebrals.   Review of Systems  HENT: Positive for voice change.   All other systems reviewed and are negative.      Objective:   Physical Exam Vitals reviewed.  HENT:     Head: Normocephalic.  Neck:     Vascular: No carotid bruit.     Comments: trach Cardiovascular:     Rate and Rhythm: Normal rate.     Pulses: Normal pulses.  Pulmonary:     Effort: Pulmonary effort is normal.  Neurological:     Mental Status: He is alert and oriented to person, place, and time.  Psychiatric:        Mood and Affect: Mood normal.        Behavior: Behavior normal.        Thought Content: Thought content normal.        Judgment: Judgment normal.     BP (!) 165/92   Pulse 73   Resp 17   Ht 5\' 4"  (1.626 m)   Wt 152 lb (68.9 kg)   BMI 26.09 kg/m   Past Medical History:  Diagnosis Date  . Cancer (HCC)     larynx  . Depression   . History of rectal cancer   . Hyperlipidemia   . Thyroid disease     Social History   Socioeconomic History  . Marital status: Married    Spouse name: Not on file  . Number of children: Not on file  . Years of education: Not on file  . Highest education level: Not on file  Occupational History  . Not on file  Tobacco Use  . Smoking status: Former Research scientist (life sciences)  . Smokeless tobacco: Never Used  Vaping Use  . Vaping Use: Never used  Substance and Sexual Activity  . Alcohol use: Yes    Comment: rarley  . Drug use: Never  . Sexual activity: Not Currently    Birth control/protection: None  Other Topics Concern  . Not on file  Social History Narrative  . Not on file   Social Determinants of Health   Financial Resource Strain: Not on file  Food Insecurity: Not on file  Transportation Needs: Not on file  Physical Activity: Not on file  Stress: Not on file  Social Connections: Not on file  Intimate Partner Violence: Not on file    Past Surgical History:  Procedure Laterality Date  .  BACK SURGERY    . CAROTID PTA/STENT INTERVENTION Left 09/20/2020   Procedure: CAROTID PTA/STENT INTERVENTION;  Surgeon: Algernon Huxley, MD;  Location: [facility];  Service: Cardiovascular;  Laterality: Left;  . LARYNGECTOMY    . LUNG BIOPSY    . WRIST SURGERY      Family History  Problem Relation Age of Onset  . Heart disease Father     Allergies  Allergen Reactions  . Lactose     Other reaction(s): Other (See Comments), Other (See Comments)  . Sulfamethoxazole-Trimethoprim Other (See Comments)    cholestasis    CBC Latest Ref Rng & Units 09/21/2020  WBC 4.0 - 10.5 K/uL 9.4  Hemoglobin 13.0 - 17.0 g/dL 13.0  Hematocrit 39.0 - 52.0 % 39.8  Platelets 150 - 400 K/uL 209      CMP     Component Value Date/Time   NA 135 09/21/2020 0413   K 4.2 09/21/2020 0413   CL 100 09/21/2020 0413   CO2 28 09/21/2020 0413   GLUCOSE 104 (H) 09/21/2020 0413   BUN  20 09/21/2020 0413   CREATININE 0.78 09/21/2020 0413   CALCIUM 8.5 (L) 09/21/2020 0413   GFRNONAA >60 09/21/2020 0413     No results found.     Assessment & Plan:   1. Carotid stenosis, left Recommend:  The patient is s/p successful left ICA stent placement   Duplex ultrasound preoperatively shows 1-39% contralateral stenosis.  Continue antiplatelet therapy as prescribed Continue management of CAD, HTN and Hyperlipidemia Healthy heart diet,  encouraged exercise at least 4 times per week  Follow up in 3 months with duplex ultrasound and physical exam based on the patient's carotid procedure.  2. Other hyperlipidemia Continue statin as ordered and reviewed, no changes at this time    Current Outpatient Medications on File Prior to Visit  Medication Sig Dispense Refill  . acetaminophen (TYLENOL) 325 MG tablet Take by mouth.    Marland Kitchen aspirin 81 MG EC tablet Take by mouth.    Marland Kitchen atorvastatin (LIPITOR) 10 MG tablet Take 1 tablet (10 mg total) by mouth daily. 30 tablet 11  . B Complex Vitamins (B COMPLEX 1 PO) Take by mouth.    . calcium carbonate 100 mg/ml SUSP Take by mouth.    . clopidogrel (PLAVIX) 75 MG tablet Take 1 tablet (75 mg total) by mouth daily. 30 tablet 6  . ibuprofen (ADVIL,MOTRIN) 200 MG tablet Take by mouth.    . levothyroxine (SYNTHROID, LEVOTHROID) 50 MCG tablet TAKE 1 TABLET ON AN EMPTY STOMACH WITH A GLASS OF WATER AT LEAST 30-60 MINUTES BEFORE BREAKFAST.  11  . Multiple Vitamins-Minerals (CENTRUM SILVER 50+MEN PO) Take by mouth.    . Omega-3 1000 MG CAPS Take by mouth.    . fexofenadine (ALLEGRA) 180 MG tablet Take by mouth. (Patient not taking: No sig reported)    . montelukast (SINGULAIR) 10 MG tablet Take by mouth. (Patient not taking: Reported on 09/21/2020)    . Omega-3 Fatty Acids (FISH OIL) 1000 MG CAPS Take by mouth. (Patient not taking: Reported on 10/21/2020)     No current facility-administered medications on file prior to visit.    There are no  Patient Instructions on file for this visit. No follow-ups on file.   Kris Hartmann, NP

## 2021-01-14 ENCOUNTER — Other Ambulatory Visit (INDEPENDENT_AMBULATORY_CARE_PROVIDER_SITE_OTHER): Payer: Self-pay | Admitting: Nurse Practitioner

## 2021-01-14 DIAGNOSIS — I6523 Occlusion and stenosis of bilateral carotid arteries: Secondary | ICD-10-CM

## 2021-01-17 ENCOUNTER — Encounter (INDEPENDENT_AMBULATORY_CARE_PROVIDER_SITE_OTHER): Payer: Self-pay | Admitting: Nurse Practitioner

## 2021-01-17 ENCOUNTER — Ambulatory Visit (INDEPENDENT_AMBULATORY_CARE_PROVIDER_SITE_OTHER): Payer: Medicare Other | Admitting: Nurse Practitioner

## 2021-01-17 ENCOUNTER — Other Ambulatory Visit: Payer: Self-pay

## 2021-01-17 ENCOUNTER — Ambulatory Visit (INDEPENDENT_AMBULATORY_CARE_PROVIDER_SITE_OTHER): Payer: Medicare Other

## 2021-01-17 VITALS — BP 135/84 | HR 80 | Ht 63.0 in | Wt 161.0 lb

## 2021-01-17 DIAGNOSIS — I6522 Occlusion and stenosis of left carotid artery: Secondary | ICD-10-CM

## 2021-01-17 DIAGNOSIS — I6523 Occlusion and stenosis of bilateral carotid arteries: Secondary | ICD-10-CM

## 2021-01-17 DIAGNOSIS — E7849 Other hyperlipidemia: Secondary | ICD-10-CM | POA: Diagnosis not present

## 2021-01-17 NOTE — Progress Notes (Signed)
Subjective:    Patient ID: Ryan Vazquez, male    DOB: 07-27-1934, 86 y.o.   MRN: [id number] Chief Complaint  Patient presents with   Follow-up    3 Mo carotid     The patient is seen for follow up evaluation of carotid stenosis. The carotid stenosis followed by ultrasound.  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  The patient denies amaurosis fugax. There is no recent history of TIA symptoms or focal motor deficits. There is no prior documented CVA.  The patient is taking enteric-coated aspirin 81 mg daily.  There is no history of migraine headaches. There is no history of seizures.  The patient has a history of coronary artery disease, no recent episodes of angina or shortness of breath. The patient denies PAD or claudication symptoms. There is a history of hyperlipidemia which is being treated with a statin.    Carotid Duplex done today shows 1 to 39% stenosis bilaterally.  No change compared to last study in 10/21/2020.     Review of Systems  HENT:  Positive for voice change.   All other systems reviewed and are negative.     Objective:   Physical Exam Vitals reviewed.  HENT:     Head: Normocephalic.  Cardiovascular:     Rate and Rhythm: Normal rate.     Pulses: Normal pulses.          Carotid pulses are  on the left side with bruit. Pulmonary:     Effort: Pulmonary effort is normal.  Skin:    General: Skin is warm and dry.  Neurological:     Mental Status: He is alert and oriented to person, place, and time.  Psychiatric:        Mood and Affect: Mood normal.        Behavior: Behavior normal.        Thought Content: Thought content normal.        Judgment: Judgment normal.    BP 135/84   Pulse 80   Ht '5\' 3"'$  (1.6 m)   Wt 161 lb (73 kg)   BMI 28.52 kg/m   Past Medical History:  Diagnosis Date   Cancer ([place])     larynx   Depression    History of rectal cancer    Hyperlipidemia    Thyroid disease     Social History   Socioeconomic History   Marital status: Married    Spouse name: Not on file   Number of children: Not on file   Years of education: Not on file   Highest education level: Not on file  Occupational History   Not on file  Tobacco Use   Smoking status: Former   Smokeless tobacco: Never  Vaping Use   Vaping Use: Never used  Substance and Sexual Activity   Alcohol use: Yes    Comment: rarley   Drug use: Never   Sexual activity: Not Currently    Birth control/protection: None  Other Topics Concern   Not on file  Social History Narrative   Not on file   Social Determinants of Health   Financial Resource Strain: Not on file  Food Insecurity: Not on file  Transportation Needs: Not on file  Physical Activity: Not on file  Stress: Not on file  Social Connections: Not on file  Intimate Partner Violence: Not on file  Past Surgical History:  Procedure Laterality Date   BACK SURGERY     CAROTID PTA/STENT INTERVENTION Left 09/20/2020   Procedure: CAROTID PTA/STENT INTERVENTION;  Surgeon: Algernon Huxley, MD;  Location: [facility];  Service: Cardiovascular;  Laterality: Left;   LARYNGECTOMY     LUNG BIOPSY     WRIST SURGERY      Family History  Problem Relation Age of Onset   Heart disease Father     Allergies  Allergen Reactions   Lactose     Other reaction(s): Other (See Comments), Other (See Comments)   Sulfamethoxazole-Trimethoprim Other (See Comments)    cholestasis    CBC Latest Ref Rng & Units 09/21/2020  WBC 4.0 - 10.5 K/uL 9.4  Hemoglobin 13.0 - 17.0 g/dL 13.0  Hematocrit 39.0 - 52.0 % 39.8  Platelets 150 - 400 K/uL 209      CMP     Component Value Date/Time   NA 135 09/21/2020 0413   K 4.2 09/21/2020 0413   CL 100 09/21/2020 0413   CO2 28 09/21/2020 0413   GLUCOSE 104 (H) 09/21/2020 0413   BUN 20 09/21/2020 0413   CREATININE 0.78  09/21/2020 0413   CALCIUM 8.5 (L) 09/21/2020 0413   GFRNONAA >60 09/21/2020 0413     No results found.     Assessment & Plan:   1. Stenosis of left carotid artery Recommend:  Given the patient's asymptomatic subcritical stenosis no further invasive testing or surgery at this time.  Duplex ultrasound shows 1-39% stenosis bilaterally.  The left carotid stent is patent.  The patient's daughter is concerned about the progression of the stenosis The innominate.  I discussed with the patient that the only way to evaluate is with CT scan they are agreeable to this.  They will have a CT scan done before next follow-up visit.  Continue antiplatelet therapy as prescribed Continue management of CAD, HTN and Hyperlipidemia Healthy heart diet,  encouraged exercise at least 4 times per week Follow up in 6 months with duplex ultrasound and physical exam   - CT ANGIO NECK W OR WO CONTRAST; Future  2. Other hyperlipidemia Continue statin as ordered and reviewed, no changes at this time     Current Outpatient Medications on File Prior to Visit  Medication Sig Dispense Refill   acetaminophen (TYLENOL) 325 MG tablet Take by mouth.     aspirin 81 MG EC tablet Take by mouth.     atorvastatin (LIPITOR) 10 MG tablet Take 1 tablet (10 mg total) by mouth daily. 30 tablet 11   B Complex Vitamins (B COMPLEX 1 PO) Take by mouth.     calcium carbonate 100 mg/ml SUSP Take by mouth.     clopidogrel (PLAVIX) 75 MG tablet Take 1 tablet (75 mg total) by mouth daily. 30 tablet 6   fexofenadine (ALLEGRA) 180 MG tablet Take by mouth.     ibuprofen (ADVIL,MOTRIN) 200 MG tablet Take by mouth.     LAGEVRIO 200 MG CAPS Take 4 capsules by mouth 2 (two) times daily.     levothyroxine (SYNTHROID, LEVOTHROID) 50 MCG tablet TAKE 1 TABLET ON AN EMPTY STOMACH WITH A GLASS OF WATER AT LEAST 30-60 MINUTES BEFORE BREAKFAST.  11   Multiple Vitamins-Minerals (CENTRUM SILVER 50+MEN PO) Take by mouth.     Omega-3 1000 MG CAPS  Take by mouth.     Omega-3 Fatty Acids (FISH OIL) 1000 MG CAPS Take by mouth.     montelukast (SINGULAIR) 10 MG  tablet Take by mouth. (Patient not taking: Reported on 09/21/2020)     No current facility-administered medications on file prior to visit.    There are no Patient Instructions on file for this visit. No follow-ups on file.   Kris Hartmann, NP

## 2021-05-03 ENCOUNTER — Other Ambulatory Visit (INDEPENDENT_AMBULATORY_CARE_PROVIDER_SITE_OTHER): Payer: Self-pay | Admitting: Vascular Surgery

## 2021-07-25 ENCOUNTER — Other Ambulatory Visit (INDEPENDENT_AMBULATORY_CARE_PROVIDER_SITE_OTHER): Payer: Self-pay | Admitting: Nurse Practitioner

## 2021-07-25 ENCOUNTER — Ambulatory Visit (INDEPENDENT_AMBULATORY_CARE_PROVIDER_SITE_OTHER): Payer: Medicare Other

## 2021-07-25 ENCOUNTER — Ambulatory Visit (INDEPENDENT_AMBULATORY_CARE_PROVIDER_SITE_OTHER): Payer: Medicare Other | Admitting: Nurse Practitioner

## 2021-07-25 ENCOUNTER — Other Ambulatory Visit: Payer: Self-pay

## 2021-07-25 VITALS — BP 125/83 | HR 84 | Ht 64.0 in | Wt 147.0 lb

## 2021-07-25 DIAGNOSIS — I6523 Occlusion and stenosis of bilateral carotid arteries: Secondary | ICD-10-CM

## 2021-07-25 DIAGNOSIS — I771 Stricture of artery: Secondary | ICD-10-CM

## 2021-07-25 DIAGNOSIS — E7849 Other hyperlipidemia: Secondary | ICD-10-CM

## 2021-07-30 ENCOUNTER — Encounter (INDEPENDENT_AMBULATORY_CARE_PROVIDER_SITE_OTHER): Payer: Self-pay | Admitting: Nurse Practitioner

## 2021-07-30 NOTE — Progress Notes (Signed)
Subjective:    Patient ID: Ryan Vazquez, male    DOB: 1935/02/10, 87 y.o.   MRN: [id number] Chief Complaint  Patient presents with   Follow-up    6 mo Carotid    The patient is seen for follow up evaluation of carotid stenosis. The carotid stenosis followed by ultrasound.  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   The patient denies amaurosis fugax. There is no recent history of TIA symptoms or focal motor deficits. There is no prior documented CVA.   The patient is taking enteric-coated aspirin 81 mg daily.   There is no history of migraine headaches. There is no history of seizures.   The patient has a history of coronary artery disease, no recent episodes of angina or shortness of breath. The patient denies PAD or claudication symptoms. There is a history of hyperlipidemia which is being treated with a statin.     Carotid Duplex done today shows 1 to 39% stenosis bilaterally.  No change compared to last study in 10/21/2020.     Review of Systems  HENT:  Positive for voice change.   All other systems reviewed and are negative.     Objective:   Physical Exam Vitals reviewed.  HENT:     Head: Normocephalic.  Neck:     Vascular: Carotid bruit present.  Cardiovascular:     Rate and Rhythm: Normal rate.  Pulmonary:     Effort: Pulmonary effort is normal.  Skin:    General: Skin is warm and dry.  Neurological:     Mental Status: He is alert and oriented to person, place, and time.  Psychiatric:        Mood and Affect: Mood normal.        Behavior: Behavior normal.        Thought Content: Thought content normal.        Judgment: Judgment normal.    BP 125/83    Pulse 84    Ht 5\' 4"  (1.626 m)    Wt 147 lb (66.7 kg)    BMI 25.23 kg/m   Past Medical History:  Diagnosis Date   Cancer ([place])    larynx   Depression    History of  rectal cancer    Hyperlipidemia    Thyroid disease     Social History   Socioeconomic History   Marital status: Married    Spouse name: Not on file   Number of children: Not on file   Years of education: Not on file   Highest education level: Not on file  Occupational History   Not on file  Tobacco Use   Smoking status: Former   Smokeless tobacco: Never  Vaping Use   Vaping Use: Never used  Substance and Sexual Activity   Alcohol use: Yes    Comment: rarley   Drug use: Never   Sexual activity: Not Currently    Birth control/protection: None  Other Topics Concern   Not on file  Social History Narrative   Not on file   Social Determinants of Health   Financial Resource Strain: Not on file  Food Insecurity: Not on file  Transportation Needs: Not on file  Physical Activity: Not on file  Stress: Not on file  Social Connections: Not on file  Intimate Partner Violence: Not on file    Past Surgical History:  Procedure  Laterality Date   BACK SURGERY     CAROTID PTA/STENT INTERVENTION Left 09/20/2020   Procedure: CAROTID PTA/STENT INTERVENTION;  Surgeon: Algernon Huxley, MD;  Location: [facility];  Service: Cardiovascular;  Laterality: Left;   LARYNGECTOMY     LUNG BIOPSY     WRIST SURGERY      Family History  Problem Relation Age of Onset   Heart disease Father     Allergies  Allergen Reactions   Lactose     Other reaction(s): Other (See Comments), Other (See Comments)   Sulfamethoxazole-Trimethoprim Other (See Comments)    cholestasis    CBC Latest Ref Rng & Units 09/21/2020  WBC 4.0 - 10.5 K/uL 9.4  Hemoglobin 13.0 - 17.0 g/dL 13.0  Hematocrit 39.0 - 52.0 % 39.8  Platelets 150 - 400 K/uL 209      CMP     Component Value Date/Time   NA 135 09/21/2020 0413   K 4.2 09/21/2020 0413   CL 100 09/21/2020 0413   CO2 28 09/21/2020 0413   GLUCOSE 104 (H) 09/21/2020 0413   BUN 20 09/21/2020 0413   CREATININE 0.78 09/21/2020 0413   CALCIUM 8.5 (L)  09/21/2020 0413   GFRNONAA >60 09/21/2020 0413     No results found.     Assessment & Plan:   1. Bilateral carotid artery stenosis Recommend:  Given the patient's asymptomatic subcritical stenosis no further invasive testing or surgery at this time.  Duplex ultrasound shows 1-39% stenosis bilaterally.  Continue antiplatelet therapy as prescribed Continue management of CAD, HTN and Hyperlipidemia Healthy heart diet,  encouraged exercise at least 4 times per week Follow up in 6 months with duplex ultrasound and physical exam    2. Innominate artery stenosis (HCC) The patient was unable to obtain the CT scan to evaluate his innominate.  The patient will obtain the scan and then we will follow-up once we have that scan.  3. Other hyperlipidemia Continue statin as ordered and reviewed, no changes at this time    Current Outpatient Medications on File Prior to Visit  Medication Sig Dispense Refill   aspirin 81 MG EC tablet Take by mouth.     B Complex Vitamins (B COMPLEX 1 PO) Take by mouth.     calcium carbonate 100 mg/ml SUSP Take by mouth.     clopidogrel (PLAVIX) 75 MG tablet TAKE 1 TABLET BY MOUTH EVERY DAY 90 tablet 2   escitalopram (LEXAPRO) 5 MG tablet Take 5 mg by mouth daily.     fexofenadine (ALLEGRA) 180 MG tablet Take by mouth.     levothyroxine (SYNTHROID, LEVOTHROID) 50 MCG tablet TAKE 1 TABLET ON AN EMPTY STOMACH WITH A GLASS OF WATER AT LEAST 30-60 MINUTES BEFORE BREAKFAST.  11   Multiple Vitamins-Minerals (CENTRUM SILVER 50+MEN PO) Take by mouth.     Omega-3 1000 MG CAPS Take by mouth.     Omega-3 Fatty Acids (FISH OIL) 1000 MG CAPS Take by mouth.     polyethylene glycol (MIRALAX / GLYCOLAX) 17 g packet Take 17 g by mouth daily.     acetaminophen (TYLENOL) 325 MG tablet Take by mouth. (Patient not taking: Reported on 07/25/2021)     atorvastatin (LIPITOR) 10 MG tablet Take 1 tablet (10 mg total) by mouth daily. 30 tablet 11   ibuprofen (ADVIL,MOTRIN) 200 MG  tablet Take by mouth. (Patient not taking: Reported on 07/25/2021)     LAGEVRIO 200 MG CAPS Take 4 capsules by mouth 2 (two) times  daily. (Patient not taking: Reported on 07/25/2021)     montelukast (SINGULAIR) 10 MG tablet Take by mouth. (Patient not taking: Reported on 09/21/2020)     No current facility-administered medications on file prior to visit.    There are no Patient Instructions on file for this visit. No follow-ups on file.   Kris Hartmann, NP

## 2021-08-22 ENCOUNTER — Ambulatory Visit
Admission: RE | Admit: 2021-08-22 | Discharge: 2021-08-22 | Disposition: A | Discharge status: Home / Self Care | Payer: Medicare Other | Source: Ambulatory Visit | Attending: Nurse Practitioner | Admitting: Nurse Practitioner

## 2021-08-22 ENCOUNTER — Other Ambulatory Visit: Payer: Self-pay

## 2021-08-22 DIAGNOSIS — I6522 Occlusion and stenosis of left carotid artery: Secondary | ICD-10-CM | POA: Diagnosis not present

## 2021-08-22 LAB — POCT I-STAT CREATININE: Creatinine, Ser: 1.1 mg/dL (ref 0.61–1.24)

## 2021-08-22 MED ORDER — IOHEXOL 350 MG/ML SOLN
75.0000 mL | Freq: Once | INTRAVENOUS | Status: AC | PRN
  Administered 2021-08-22: 75 mL via INTRAVENOUS

## 2021-08-30 ENCOUNTER — Ambulatory Visit (INDEPENDENT_AMBULATORY_CARE_PROVIDER_SITE_OTHER): Payer: Medicare Other | Admitting: Vascular Surgery

## 2021-08-30 ENCOUNTER — Encounter (INDEPENDENT_AMBULATORY_CARE_PROVIDER_SITE_OTHER): Payer: Self-pay | Admitting: Vascular Surgery

## 2021-08-30 ENCOUNTER — Other Ambulatory Visit: Payer: Self-pay

## 2021-08-30 VITALS — BP 148/95 | HR 73 | Resp 15 | Wt 152.4 lb

## 2021-08-30 DIAGNOSIS — I771 Stricture of artery: Secondary | ICD-10-CM

## 2021-08-30 DIAGNOSIS — I6523 Occlusion and stenosis of bilateral carotid arteries: Secondary | ICD-10-CM

## 2021-08-30 DIAGNOSIS — Z8521 Personal history of malignant neoplasm of larynx: Secondary | ICD-10-CM

## 2021-08-30 NOTE — Progress Notes (Signed)
? ? ?MRN : [id number] ? ?Ryan Vazquez is a 87 y.o. (26-Jan-1935) male who presents with chief complaint of  ?Chief Complaint  ?Patient presents with  ? Follow-up  ?  Ct results  ?. ? ?History of Present Illness: Patient returns in follow-up after his CT angiogram which I have independently reviewed.  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  At that time, he was found to have both left subclavian artery stenosis that was likely moderate and a relatively high-grade innominate artery stenosis.  A CT angiogram of the chest was ordered for further evaluation of this previously but he only had this recently.  I have independently reviewed his CT angiogram and this clearly demonstrates a high-grade stenosis of the innominate artery.  The radiologist report of 60% or greater would be a marked underrepresentation in my opinion.  He also has a 70% or greater left subclavian artery stenosis.  His left cervical carotid stent is widely patent. ? ?Current Outpatient Medications  ?Medication Sig Dispense Refill  ? aspirin 81 MG EC tablet Take by mouth.    ? B Complex Vitamins (B COMPLEX 1 PO) Take by mouth.    ? calcium carbonate 100 mg/ml SUSP Take by mouth.    ? clopidogrel (PLAVIX) 75 MG tablet TAKE 1 TABLET BY MOUTH EVERY DAY 90 tablet 2  ? escitalopram (LEXAPRO) 5 MG tablet Take 5 mg by mouth daily.    ? fexofenadine (ALLEGRA) 180 MG tablet Take by mouth.    ? levothyroxine (SYNTHROID, LEVOTHROID) 50 MCG tablet TAKE 1 TABLET ON AN EMPTY STOMACH WITH A GLASS OF WATER AT LEAST 30-60 MINUTES BEFORE BREAKFAST.  11  ? Multiple Vitamins-Minerals (CENTRUM SILVER 50+MEN PO) Take by mouth.    ? Omega-3 1000 MG CAPS Take by mouth.    ? Omega-3 Fatty Acids (FISH OIL) 1000 MG CAPS Take by mouth.    ? polyethylene glycol (MIRALAX / GLYCOLAX) 17 g packet Take 17 g by mouth daily.    ? acetaminophen (TYLENOL) 325 MG tablet Take by  mouth. (Patient not taking: Reported on 07/25/2021)    ? atorvastatin (LIPITOR) 10 MG tablet Take 1 tablet (10 mg total) by mouth daily. 30 tablet 11  ? ibuprofen (ADVIL,MOTRIN) 200 MG tablet Take by mouth. (Patient not taking: Reported on 07/25/2021)    ? LAGEVRIO 200 MG CAPS Take 4 capsules by mouth 2 (two) times daily. (Patient not taking: Reported on 07/25/2021)    ? montelukast (SINGULAIR) 10 MG tablet Take by mouth. (Patient not taking: Reported on 09/21/2020)    ? ?No current facility-administered medications for this visit.  ? ? ?Past Medical History:  ?Diagnosis Date  ? Cancer [facility])   ? larynx  ? Depression   ? History of rectal cancer   ? Hyperlipidemia   ? Thyroid disease   ? ? ?Past Surgical History:  ?Procedure Laterality Date  ? BACK SURGERY    ? CAROTID PTA/STENT INTERVENTION Left 09/20/2020  ? Procedure: CAROTID PTA/STENT INTERVENTION;  Surgeon: Algernon Huxley, MD;  Location: [facility];  Service: Cardiovascular;  Laterality: Left;  ? LARYNGECTOMY    ? LUNG BIOPSY    ? WRIST SURGERY    ? ? ? ?Social History  ? ?Tobacco Use  ? Smoking status: Former  ? Smokeless tobacco: Never  ?Vaping Use  ? Vaping Use: Never used  ?Substance Use Topics  ? Alcohol use: Yes  ?  Comment: rarley  ? Drug use: Never  ? ?  ? ? ?Family History  ?Problem Relation Age of Onset  ? Heart disease Father   ? ? ? ?Allergies  ?Allergen Reactions  ? Lactose   ?  Other reaction(s): Other (See Comments), Other (See Comments)  ? Sulfamethoxazole-Trimethoprim Other (See Comments)  ?  cholestasis  ? ? ?REVIEW OF SYSTEMS (Negative unless checked) ?  ?Constitutional: '[]'$ Weight loss  '[]'$ Fever  '[]'$ Chills ?Cardiac: '[]'$ Chest pain   '[]'$ Chest pressure   '[]'$ Palpitations   '[]'$ Shortness of breath when laying flat   '[]'$ Shortness of breath at rest   '[]'$ Shortness of breath with exertion. ?Vascular:  '[]'$ Pain in legs with walking   '[]'$ Pain in legs at rest   '[]'$ Pain in legs when laying flat   '[]'$ Claudication   '[]'$ Pain in feet when walking  '[]'$ Pain in feet at rest  '[]'$ Pain  in feet when laying flat   '[]'$ History of DVT   '[]'$ Phlebitis   '[]'$ Swelling in legs   '[]'$ Varicose veins   '[]'$ Non-healing ulcers ?Pulmonary:   '[]'$ Uses home oxygen   '[]'$ Productive cough   '[]'$ Hemoptysis   '[]'$ Wheeze  '[]'$ COPD   '[]'$ Asthma ?Neurologic:  '[x]'$ Dizziness  '[]'$ Blackouts   '[]'$ Seizures   '[]'$ History of stroke   '[]'$ History of TIA  '[]'$ Aphasia   '[]'$ Temporary blindness   '[]'$ Dysphagia   '[x]'$ Weakness or numbness in arms   '[]'$ Weakness or numbness in legs ?Musculoskeletal:  '[x]'$ Arthritis   '[]'$ Joint swelling   '[]'$ Joint pain   '[]'$ Low back pain ?Hematologic:  '[]'$ Easy bruising  '[]'$ Easy bleeding   '[]'$ Hypercoagulable state   '[]'$ Anemic  '[]'$ Hepatitis ?Gastrointestinal:  '[]'$ Blood in stool   '[]'$ Vomiting blood  '[]'$ Gastroesophageal reflux/heartburn   '[]'$ Abdominal pain ?Genitourinary:  '[]'$ Chronic kidney disease   '[]'$ Difficult urination  '[]'$ Frequent urination  '[]'$ Burning with urination   '[]'$ Hematuria ?Skin:  '[]'$ Rashes   '[]'$ Ulcers   '[]'$ Wounds ?Psychological:  '[]'$ History of anxiety   '[x]'$  History of major depression. ? ?Physical Examination ? ?Vitals:  ? 08/30/21 1110  ?BP: (!) 148/95  ?Pulse: 73  ?Resp: 15  ?Weight: 152 lb 7.2 oz (69.2 kg)  ? ?Body mass index is 26.17 kg/m?. ?Gen:  WD/WN, NAD ?Head: [place]/AT, No temporalis wasting. ?Ear/Nose/Throat: Hearing grossly intact, nares w/o erythema or drainage,  ?Eyes: Conjunctiva clear. Sclera non-icteric ?Neck: Supple.  No bruit.  Status post laryngectomy ?Pulmonary:  Good air movement, equal and clear to auscultation bilaterally.  ?Cardiac: Irregular ?Vascular:  ?Vessel Right Left  ?Radial Palpable Palpable  ?    ?    ? ?Musculoskeletal: M/S 5/5 throughout.  No deformity or atrophy.  Trace lower extremity edema. ?Neurologic: CN 2-12 intact. Sensation grossly intact in extremities.  Symmetrical.  Nonverbal after laryngectomy ?Psychiatric: Judgment intact, Mood & affect appropriate for pt's clinical situation. ?Dermatologic: No rashes or ulcers noted.  No cellulitis or open wounds. ? ? ? ? ?CBC ?Lab Results  ?Component Value Date  ? WBC 9.4  09/21/2020  ? HGB 13.0 09/21/2020  ? HCT 39.8 09/21/2020  ? MCV 95.0 09/21/2020  ? PLT 209 09/21/2020  ? ? ?BMET ?   ?Component Value Date/Time  ? NA 135 09/21/2020 0413  ? K 4.2 09/21/2020 0413  ? CL 100 09/21/2020 0413  ? CO2 28 09/21/2020 0413  ? GLUCOSE 104 (H) 09/21/2020 0413  ? BUN 20 09/21/2020 0413  ? CREATININE 1.10 08/22/2021 1115  ? CALCIUM 8.5 (L) 09/21/2020 0413  ? GFRNONAA >60 09/21/2020 0413  ? ?Estimated Creatinine Clearance: 41.9 mL/min (by C-G formula based on SCr of 1.1 mg/dL). ? ?  COAG ?No results found for: INR, PROTIME ? ?Radiology ?CT ANGIO NECK W OR WO CONTRAST ? ?Result Date: 08/22/2021 ?CLINICAL DATA:  Carotid artery stenosis EXAM: CT ANGIOGRAPHY NECK TECHNIQUE: Multidetector CT imaging of the neck was performed using the standard protocol during bolus administration of intravenous contrast. Multiplanar CT image reconstructions and MIPs were obtained to evaluate the vascular anatomy. Carotid stenosis measurements (when applicable) are obtained utilizing NASCET criteria, using the distal internal carotid diameter as the denominator. RADIATION DOSE REDUCTION: This exam was performed according to the departmental dose-optimization program which includes automated exposure control, adjustment of the mA and/or kV according to patient size and/or use of iterative reconstruction technique. CONTRAST:  66m OMNIPAQUE IOHEXOL 350 MG/ML SOLN COMPARISON:  Carotid Doppler 04/12/2020, PET-CT 04/12/2011 FINDINGS: Aortic arch: There is calcified atherosclerotic plaque of the aortic arch without evidence of hemodynamically significant stenosis. 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 There is primarily soft plaque in the right subclavian artery distal to the origin of the common carotid artery resulting in up to approximately 60% stenosis. The distal right subclavian artery is patent. There is mild stenosis at the origin of the left common carotid and subclavian  arteries. There is mixed plaque in the left subclavian artery just prior to the origin of the left vertebral artery resulting in up to approximately 60-70% stenosis. The distal left subclavian artery is pate

## 2021-08-30 NOTE — Assessment & Plan Note (Signed)
Left carotid stent is widely patent.  Cervical right carotid artery with mild disease but has a proximal lesion in the innominate artery which is high-grade. ?

## 2021-08-30 NOTE — Assessment & Plan Note (Signed)
A CT angiogram of the chest was ordered for further evaluation of this previously but he only had this recently.  I have independently reviewed his CT angiogram and this clearly demonstrates a high-grade stenosis of the innominate artery.  The radiologist report of 60% or greater would be a marked underrepresentation in my opinion.  He also has a 70% or greater left subclavian artery stenosis.  His left cervical carotid stent is widely patent. ?Given this finding, consideration for innominate artery stenting should be given for stroke risk reduction.  This may require both a femoral and a radial approach although that is not entirely clear.  I have discussed the pathophysiology and natural history of innominate stenosis.  The patient is not clearly symptomatic from this lesion, and is 56 with advanced comorbidities as well so medical management is not unreasonable.  We discussed the risks and benefits of both of these alternatives with the patient and his daughter today.  They are going to go home and discuss this among family and call us with her decision.  If he decides to have an innominate stent, this will be done in the near future at his convenience.  If not, going to follow him with carotid duplex going forward ?

## 2021-11-23 ENCOUNTER — Other Ambulatory Visit: Payer: Self-pay | Admitting: Internal Medicine

## 2021-11-23 DIAGNOSIS — R410 Disorientation, unspecified: Secondary | ICD-10-CM

## 2022-07-24 ENCOUNTER — Other Ambulatory Visit (INDEPENDENT_AMBULATORY_CARE_PROVIDER_SITE_OTHER): Payer: Self-pay | Admitting: Vascular Surgery

## 2022-07-24 DIAGNOSIS — I6523 Occlusion and stenosis of bilateral carotid arteries: Secondary | ICD-10-CM

## 2022-07-25 ENCOUNTER — Ambulatory Visit (INDEPENDENT_AMBULATORY_CARE_PROVIDER_SITE_OTHER): Payer: Medicare Other | Admitting: Vascular Surgery

## 2022-07-25 ENCOUNTER — Encounter (INDEPENDENT_AMBULATORY_CARE_PROVIDER_SITE_OTHER): Payer: Self-pay | Admitting: Vascular Surgery

## 2022-07-25 ENCOUNTER — Ambulatory Visit (INDEPENDENT_AMBULATORY_CARE_PROVIDER_SITE_OTHER): Payer: Medicare Other

## 2022-07-25 VITALS — BP 170/86 | HR 71 | Ht 64.0 in | Wt 150.0 lb

## 2022-07-25 DIAGNOSIS — I6523 Occlusion and stenosis of bilateral carotid arteries: Secondary | ICD-10-CM

## 2022-07-25 DIAGNOSIS — Z8521 Personal history of malignant neoplasm of larynx: Secondary | ICD-10-CM | POA: Diagnosis not present

## 2022-07-25 DIAGNOSIS — I771 Stricture of artery: Secondary | ICD-10-CM | POA: Diagnosis not present

## 2022-07-25 DIAGNOSIS — E7849 Other hyperlipidemia: Secondary | ICD-10-CM | POA: Diagnosis not present

## 2022-07-25 NOTE — Progress Notes (Signed)
MRN : [id number]  Ryan Vazquez is a 88 y.o. (1934-08-15) male who presents with chief complaint of  Chief Complaint  Patient presents with   Follow-up    1 year carotid follow up  .  History of Present Illness: Patient returns today in follow up of carotid stenosis.  He is almost 2 years status post left carotid stent placement for high-grade stenosis.  He has a known innominate artery stenosis that he and his family have elected to treat medically.  He does have a lot of lethargy and fatigue and sleeps a lot, but does not have any focal neurologic symptoms. Specifically, the patient denies amaurosis fugax, speech or swallowing difficulties, or arm or leg weakness or numbness. Carotid duplex today reveals a widely patent left carotid artery stent and velocities and visualization that would fall in the 1 to 39% stenosis range on the right.  There may be some diminishment of the velocities on the right due to the innominate stenosis  Current Outpatient Medications  Medication Sig Dispense Refill   aspirin 81 MG EC tablet Take by mouth.     atorvastatin (LIPITOR) 10 MG tablet Take 1 tablet (10 mg total) by mouth daily. 30 tablet 11   B Complex Vitamins (B COMPLEX 1 PO) Take by mouth.     clopidogrel (PLAVIX) 75 MG tablet TAKE 1 TABLET BY MOUTH EVERY DAY 90 tablet 2   escitalopram (LEXAPRO) 5 MG tablet Take 5 mg by mouth daily.     fexofenadine (ALLEGRA) 180 MG tablet Take by mouth.     levothyroxine (SYNTHROID, LEVOTHROID) 50 MCG tablet TAKE 1 TABLET ON AN EMPTY STOMACH WITH A GLASS OF WATER AT LEAST 30-60 MINUTES BEFORE BREAKFAST.  11   Multiple Vitamins-Minerals (CENTRUM SILVER 50+MEN PO) Take by mouth.     Omega-3 Fatty Acids (FISH OIL) 1000 MG CAPS Take by mouth.     polyethylene glycol (MIRALAX / GLYCOLAX) 17 g packet Take 17 g by mouth daily.     No current facility-administered medications for this visit.    Past Medical History:  Diagnosis Date   Cancer ([place])     larynx   Depression    History of rectal cancer    Hyperlipidemia    Thyroid disease     Past Surgical History:  Procedure Laterality Date   BACK SURGERY     CAROTID PTA/STENT INTERVENTION Left 09/20/2020   Procedure: CAROTID PTA/STENT INTERVENTION;  Surgeon: Algernon Huxley, MD;  Location: [facility];  Service: Cardiovascular;  Laterality: Left;   LARYNGECTOMY     LUNG BIOPSY     WRIST SURGERY       Social History   Tobacco Use   Smoking status: Former   Smokeless tobacco: Never  Vaping Use   Vaping Use: Never used  Substance Use Topics   Alcohol use: Yes    Comment: rarley   Drug use: Never      Family History  Problem Relation Age of Onset   Heart disease Father      Allergies  Allergen Reactions   Lactose     Other reaction(s): Other (See Comments), Other (See Comments)   Sulfamethoxazole-Trimethoprim Other (See Comments)    cholestasis   Cefuroxime Rash    REVIEW OF SYSTEMS (Negative unless checked)   Constitutional: '[]'$ Weight loss  '[]'$ Fever  '[]'$ Chills Cardiac: '[]'$ Chest pain   '[]'$ Chest pressure   '[]'$ Palpitations   '[]'$ Shortness of breath when laying flat   '[]'$ Shortness of breath at  rest   '[]'$ Shortness of breath with exertion. Vascular:  '[]'$ Pain in legs with walking   '[]'$ Pain in legs at rest   '[]'$ Pain in legs when laying flat   '[]'$ Claudication   '[]'$ Pain in feet when walking  '[]'$ Pain in feet at rest  '[]'$ Pain in feet when laying flat   '[]'$ History of DVT   '[]'$ Phlebitis   '[]'$ Swelling in legs   '[]'$ Varicose veins   '[]'$ Non-healing ulcers Pulmonary:   '[]'$ Uses home oxygen   '[]'$ Productive cough   '[]'$ Hemoptysis   '[]'$ Wheeze  '[]'$ COPD   '[]'$ Asthma Neurologic:  '[x]'$ Dizziness  '[]'$ Blackouts   '[]'$ Seizures   '[]'$ History of stroke   '[]'$ History of TIA  '[]'$ Aphasia   '[]'$ Temporary blindness   '[]'$ Dysphagia   '[x]'$ Weakness or numbness in arms   '[]'$ Weakness or numbness in legs Musculoskeletal:  '[x]'$ Arthritis   '[]'$ Joint swelling   '[]'$ Joint pain   '[]'$ Low back pain Hematologic:  '[]'$ Easy bruising  '[]'$ Easy bleeding    '[]'$ Hypercoagulable state   '[]'$ Anemic  '[]'$ Hepatitis Gastrointestinal:  '[]'$ Blood in stool   '[]'$ Vomiting blood  '[]'$ Gastroesophageal reflux/heartburn   '[]'$ Abdominal pain Genitourinary:  '[]'$ Chronic kidney disease   '[]'$ Difficult urination  '[]'$ Frequent urination  '[]'$ Burning with urination   '[]'$ Hematuria Skin:  '[]'$ Rashes   '[]'$ Ulcers   '[]'$ Wounds Psychological:  '[]'$ History of anxiety   '[x]'$  History of major depression.  Physical Examination  BP (!) 170/86   Pulse 71   Ht '5\' 4"'$  (1.626 m)   Wt 150 lb (68 kg)   BMI 25.75 kg/m  Gen:  WD/WN, NAD Head: [place]/AT, No temporalis wasting. Ear/Nose/Throat: Hearing grossly intact, nares w/o erythema or drainage Eyes: Conjunctiva clear. Sclera non-icteric Neck: Supple.  S/p laryngectomy Pulmonary:  Good air movement, no use of accessory muscles.  Cardiac: irregular Vascular:  Vessel Right Left  Radial Palpable Palpable               Musculoskeletal: M/S 5/5 throughout.  No deformity or atrophy. No edema. Neurologic: Sensation grossly intact in extremities.  Symmetrical.   Psychiatric: Judgment intact, Mood & affect appropriate for pt's clinical situation. Dermatologic: No rashes or ulcers noted.  No cellulitis or open wounds.      Labs No results found for this or any previous visit (from the past 2160 hour(s)).  Radiology No results found.  Assessment/Plan History of laryngeal cancer Status post laryngectomy and tracheostomy. He also had radiation which could contribute to his carotid stenosis.   Other hyperlipidemia lipid control important in reducing the progression of atherosclerotic disease. Continue statin therapy  Innominate artery stenosis (HCC) No clear focal neurologic symptoms and he and the family have elected to treat this medically with dual antiplatelet therapy and statin agent which she will continue.  Carotid stenosis Carotid duplex today reveals a widely patent left carotid artery stent and velocities and visualization that would fall in  the 1 to 39% stenosis range on the right.  There may be some diminishment of the velocities on the right due to the innominate stenosis, but he does not have any clearly high-grade stenosis in the carotid artery and has no focal symptoms.  Continue current medical regimen.  Recheck in 1 year.    [name], MD  07/25/2022 11:30 AM    This note was created with Dragon medical transcription system.  Any errors from dictation are purely unintentional

## 2022-07-25 NOTE — Assessment & Plan Note (Signed)
Carotid duplex today reveals a widely patent left carotid artery stent and velocities and visualization that would fall in the 1 to 39% stenosis range on the right.  There may be some diminishment of the velocities on the right due to the innominate stenosis, but he does not have any clearly high-grade stenosis in the carotid artery and has no focal symptoms.  Continue current medical regimen.  Recheck in 1 year.

## 2022-07-25 NOTE — Assessment & Plan Note (Signed)
No clear focal neurologic symptoms and he and the family have elected to treat this medically with dual antiplatelet therapy and statin agent which she will continue.

## 2022-08-25 ENCOUNTER — Emergency Department: Payer: Medicare Other

## 2022-08-25 ENCOUNTER — Emergency Department
Admission: EM | Admit: 2022-08-25 | Discharge: 2022-08-25 | Disposition: A | Discharge status: Home / Self Care | Payer: Medicare Other | Attending: Emergency Medicine | Admitting: Emergency Medicine

## 2022-08-25 ENCOUNTER — Other Ambulatory Visit: Payer: Self-pay

## 2022-08-25 ENCOUNTER — Encounter: Payer: Self-pay | Admitting: Intensive Care

## 2022-08-25 DIAGNOSIS — I6782 Cerebral ischemia: Secondary | ICD-10-CM | POA: Insufficient documentation

## 2022-08-25 DIAGNOSIS — Z1152 Encounter for screening for COVID-19: Secondary | ICD-10-CM | POA: Diagnosis not present

## 2022-08-25 DIAGNOSIS — R911 Solitary pulmonary nodule: Secondary | ICD-10-CM | POA: Insufficient documentation

## 2022-08-25 DIAGNOSIS — R7989 Other specified abnormal findings of blood chemistry: Secondary | ICD-10-CM | POA: Insufficient documentation

## 2022-08-25 DIAGNOSIS — R531 Weakness: Secondary | ICD-10-CM | POA: Insufficient documentation

## 2022-08-25 LAB — CBC
HCT: 42.5 % (ref 39.0–52.0)
Hemoglobin: 13.5 g/dL (ref 13.0–17.0)
MCH: 30.6 pg (ref 26.0–34.0)
MCHC: 31.8 g/dL (ref 30.0–36.0)
MCV: 96.4 fL (ref 80.0–100.0)
Platelets: 251 10*3/uL (ref 150–400)
RBC: 4.41 MIL/uL (ref 4.22–5.81)
RDW: 12.2 % (ref 11.5–15.5)
WBC: 9.2 10*3/uL (ref 4.0–10.5)
nRBC: 0 % (ref 0.0–0.2)

## 2022-08-25 LAB — URINALYSIS, COMPLETE (UACMP) WITH MICROSCOPIC
Bacteria, UA: NONE SEEN
Bilirubin Urine: NEGATIVE
Glucose, UA: NEGATIVE mg/dL
Hgb urine dipstick: NEGATIVE
Ketones, ur: NEGATIVE mg/dL
Leukocytes,Ua: NEGATIVE
Nitrite: NEGATIVE
Protein, ur: NEGATIVE mg/dL
Specific Gravity, Urine: 1.024 (ref 1.005–1.030)
pH: 5 (ref 5.0–8.0)

## 2022-08-25 LAB — COMPREHENSIVE METABOLIC PANEL
ALT: 51 U/L — ABNORMAL HIGH (ref 0–44)
AST: 44 U/L — ABNORMAL HIGH (ref 15–41)
Albumin: 3.7 g/dL (ref 3.5–5.0)
Alkaline Phosphatase: 112 U/L (ref 38–126)
Anion gap: 7 (ref 5–15)
BUN: 33 mg/dL — ABNORMAL HIGH (ref 8–23)
CO2: 30 mmol/L (ref 22–32)
Calcium: 9.1 mg/dL (ref 8.9–10.3)
Chloride: 101 mmol/L (ref 98–111)
Creatinine, Ser: 1.02 mg/dL (ref 0.61–1.24)
GFR, Estimated: >60 mL/min (ref >60)
Glucose, Bld: 103 mg/dL — ABNORMAL HIGH (ref 70–99)
Potassium: 4.3 mmol/L (ref 3.5–5.1)
Sodium: 138 mmol/L (ref 135–145)
Total Bilirubin: 0.8 mg/dL (ref 0.3–1.2)
Total Protein: 7.8 g/dL (ref 6.5–8.1)

## 2022-08-25 LAB — DIFFERENTIAL
Abs Immature Granulocytes: 0.02 10*3/uL (ref 0.00–0.07)
Basophils Absolute: 0.1 10*3/uL (ref 0.0–0.1)
Basophils Relative: 1 %
Eosinophils Absolute: 0.5 10*3/uL (ref 0.0–0.5)
Eosinophils Relative: 5 %
Immature Granulocytes: 0 %
Lymphocytes Relative: 16 %
Lymphs Abs: 1.5 10*3/uL (ref 0.7–4.0)
Monocytes Absolute: 0.9 10*3/uL (ref 0.1–1.0)
Monocytes Relative: 10 %
Neutro Abs: 6.3 10*3/uL (ref 1.7–7.7)
Neutrophils Relative %: 68 %

## 2022-08-25 LAB — RESP PANEL BY RT-PCR (RSV, FLU A&B, COVID)  RVPGX2
Influenza A by PCR: NEGATIVE
Influenza B by PCR: NEGATIVE
Resp Syncytial Virus by PCR: NEGATIVE
SARS Coronavirus 2 by RT PCR: NEGATIVE

## 2022-08-25 LAB — TROPONIN I (HIGH SENSITIVITY): Troponin I (High Sensitivity): 12 ng/L (ref <18)

## 2022-08-25 LAB — APTT: aPTT: 39 seconds — ABNORMAL HIGH (ref 24–36)

## 2022-08-25 LAB — PROTIME-INR
INR: 1 (ref 0.8–1.2)
Prothrombin Time: 12.9 seconds (ref 11.4–15.2)

## 2022-08-25 MED ORDER — SODIUM CHLORIDE 0.9 % IV BOLUS
1000.0000 mL | Freq: Once | INTRAVENOUS | Status: AC
  Administered 2022-08-25: 1000 mL via INTRAVENOUS

## 2022-08-25 NOTE — ED Triage Notes (Signed)
Patient presents with family who states he has had AMS X1 week. Patient found by daughter yesterday on ground outside from fall.   Baseline ambulatory very slow with no devices but unable to ambulate today.  Sent by [facility] to rule out head injury/possible stroke/dehydration.

## 2022-08-25 NOTE — ED Provider Notes (Signed)
[facility] Provider Note    Event Date/Time   First MD Initiated Contact with Patient 08/25/22 1125     (approximate)  History   Chief Complaint: Altered Mental Status and Fall  HPI  Ryan Vazquez is a 88 y.o. male with a past medical history of depression, thyroid disease status post tracheostomy, hyperlipidemia, presents to the emergency department for increasing weakness.  According to the daughter who is here with the patient and interpreting for the patient (patient speaks mostly [place]) they have noticed over the past week or so that the patient has been weaker and having more difficulty ambulating.  Had a fall yesterday but no obvious signs of head trauma.  Patient was having increased difficulty ambulating they try to get in with a primary care doctor but were not able to get in until Monday so they came to the emergency department for evaluation.  Physical Exam   Triage Vital Signs: ED Triage Vitals  Enc Vitals Group     BP 08/25/22 1044 (!) 139/95     Pulse Rate 08/25/22 1044 77     Resp 08/25/22 1044 18     Temp 08/25/22 1044 98.2 F (36.8 C)     Temp Source 08/25/22 1044 Oral     SpO2 08/25/22 1044 94 %     Weight 08/25/22 1040 158 lb (71.7 kg)     Height 08/25/22 1040 '5\' 3"'$  (1.6 m)     Head Circumference --      Peak Flow --      Pain Score 08/25/22 1040 0     Pain Loc --      Pain Edu? --      Excl. in [place]? --     Most recent vital signs: Vitals:   08/25/22 1044  BP: (!) 139/95  Pulse: 77  Resp: 18  Temp: 98.2 F (36.8 C)  SpO2: 94%    General: Awake, no distress.  CV:  Good peripheral perfusion.  Regular rate and rhythm  Resp:  Normal effort.  Equal breath sounds bilaterally.  Abd:  No distention.  Soft, nontender.  No rebound or guarding.  ED Results / Procedures / Treatments   EKG  EKG viewed and interpreted by myself shows a sinus rhythm at 77 bpm with a narrow QRS, normal axis, normal intervals, no  concerning ST changes.  RADIOLOGY  I reviewed and interpreted CT head images.  Patient has a large ventricles but no obvious bleed or significant abnormality seen on my evaluation. Radiology is read as negative for acute intracranial abnormality.  Moderate chronic microvascular changes.   MEDICATIONS ORDERED IN ED: Medications - No data to display   IMPRESSION / MDM / [place] / ED COURSE  I reviewed the triage vital signs and the nursing notes.  Patient's presentation is most consistent with acute presentation with potential threat to life or bodily function.  Patient presents emergency department for generalized weakness over the past week or so.  Overall the patient appears well, no distress.  Vital signs reassuring.  No concerning findings on physical exam.  Daughter is here with the patient, she states she is not sure if this is just an indication of dementia or if there was an acute issue going on with the patient.  States he does not drink much water and she is concerned that he could be dehydrated.  We will check labs given the patient's generalized weakness and fatigue will also obtain a COVID/flu/RSV.  Given the patient's tracheostomy and daughter states he has not been suctioning very much lately we will obtain an x-ray to rule out pneumonia.  Given the fall yesterday and generalized weakness we will obtain a CT scan of the head to rule out intracranial abnormality.  Daughter agreeable to plan of care and workup.  Patient's labs have resulted showing no significant finding, CBC is normal with a normal white blood cell count, chemistry shows slight LFT elevation (no abdominal tenderness) but no other concerning finding.  Urinalysis is pending.  I have added on a troponin as a precaution.  Imaging is pending as well.  CT scan head shows no significant findings per radiology but does show chronic microvascular changes.  Chest x-ray shows possibly concerning nodule, CT scan of  the chest shows largely unchanged from prior imaging.  Urinalysis is normal, COVID/flu/RSV is negative, CBC is normal with a normal white blood cell count, chemistry shows no significant findings troponin negative.  Given the patient's reassuring workup I discussed findings with the daughter.  Daughter believes this could be more dementia type symptoms and will follow-up with the PCP this could very well be dementia type symptoms especially given the moderate microvascular changes on CT and progressive decline that the daughter is describing.  Patient will follow-up with PCP, daughter agreeable to plan of care.  FINAL CLINICAL IMPRESSION(S) / ED DIAGNOSES   Weakness   Note:  This document was prepared using Dragon voice recognition software and may include unintentional dictation errors.   Harvest Dark, MD 08/25/22 1351

## 2022-08-25 NOTE — ED Notes (Signed)
First nurse note: Pt sent from [facility] clinic, s/s started 1 week ago.

## 2022-10-09 ENCOUNTER — Other Ambulatory Visit: Payer: Self-pay | Admitting: Internal Medicine

## 2022-10-09 DIAGNOSIS — I2089 Other forms of angina pectoris: Secondary | ICD-10-CM

## 2022-11-03 ENCOUNTER — Telehealth (HOSPITAL_COMMUNITY): Payer: Self-pay | Admitting: Emergency Medicine

## 2022-11-03 DIAGNOSIS — R079 Chest pain, unspecified: Secondary | ICD-10-CM

## 2022-11-03 MED ORDER — METOPROLOL TARTRATE 100 MG PO TABS: 100.0000 mg | ORAL_TABLET | Freq: Once | ORAL | 0 refills | Status: AC

## 2022-11-03 NOTE — Telephone Encounter (Signed)
Reaching out to patient to offer assistance regarding upcoming cardiac imaging study; pt verbalizes understanding of appt date/time, parking situation and where to check in, pre-test NPO status and medications ordered, and verified current allergies; name and call back number provided for further questions should they arise Rockwell Alexandria RN Navigator Cardiac Imaging [facility] [phone] office [phone] cell  100mg  metoprolol tart  Hold losartan

## 2022-11-06 ENCOUNTER — Ambulatory Visit
Admission: RE | Admit: 2022-11-06 | Discharge: 2022-11-06 | Disposition: A | Discharge status: Home / Self Care | Payer: Medicare Other | Source: Ambulatory Visit | Attending: Internal Medicine | Admitting: Internal Medicine

## 2022-11-06 ENCOUNTER — Other Ambulatory Visit: Payer: Self-pay | Admitting: Cardiology

## 2022-11-06 ENCOUNTER — Ambulatory Visit
Admission: RE | Admit: 2022-11-06 | Discharge: 2022-11-06 | Disposition: A | Discharge status: Home / Self Care | Payer: Medicare Other | Source: Ambulatory Visit | Attending: Cardiology | Admitting: Cardiology

## 2022-11-06 DIAGNOSIS — R071 Chest pain on breathing: Secondary | ICD-10-CM

## 2022-11-06 DIAGNOSIS — R931 Abnormal findings on diagnostic imaging of heart and coronary circulation: Secondary | ICD-10-CM | POA: Insufficient documentation

## 2022-11-06 DIAGNOSIS — I2510 Atherosclerotic heart disease of native coronary artery without angina pectoris: Secondary | ICD-10-CM | POA: Diagnosis not present

## 2022-11-06 DIAGNOSIS — I2089 Other forms of angina pectoris: Secondary | ICD-10-CM | POA: Diagnosis present

## 2022-11-06 MED ORDER — IOHEXOL 350 MG/ML SOLN
100.0000 mL | Freq: Once | INTRAVENOUS | Status: AC | PRN
  Administered 2022-11-06: 100 mL via INTRAVENOUS

## 2022-11-06 MED ORDER — NITROGLYCERIN 0.4 MG SL SUBL
0.8000 mg | SUBLINGUAL_TABLET | Freq: Once | SUBLINGUAL | Status: AC
  Administered 2022-11-06: 0.8 mg via SUBLINGUAL
  Filled 2022-11-06: qty 25

## 2022-11-06 MED ORDER — SODIUM CHLORIDE 0.9 % IV SOLN: Freq: Once | INTRAVENOUS | Status: AC

## 2022-11-06 MED ORDER — IOHEXOL 350 MG/ML SOLN: 75.0000 mL | Freq: Once | INTRAVENOUS | Status: DC | PRN

## 2022-11-06 MED ORDER — NITROGLYCERIN 0.4 MG SL SUBL
SUBLINGUAL_TABLET | SUBLINGUAL | Status: AC
  Filled 2022-11-06: qty 2

## 2022-11-06 NOTE — Progress Notes (Signed)
Pt completed procedure well with no issues. Pt denies any complaints. Pt ABCs intract. Pt encouraged to drink plenty of water throughout the day. Pt ambulatory with steady gait to wheelchair.

## 2023-03-13 IMAGING — MR MR ABDOMEN WO/W CM
17 series · 48 of 48 positions shown · IV contrast (7ml Gadavist)
Comparison: Abdominal ultrasound, 08/09/2020

CLINICAL DATA: Characterize left lobe liver lesion identified by
ultrasound, history of laryngeal cancer

EXAM:
MRI ABDOMEN WITHOUT AND WITH CONTRAST
TECHNIQUE: Multiplanar multisequence MR imaging of the abdomen was performed
both before and after the administration of intravenous contrast.
CONTRAST:  7mL GADAVIST GADOBUTROL 1 MMOL/ML IV SOLN

[Series 5: T2 · coronal · 6.0mm · 1.19mm/px · 3 of 33 slices shown (1 of 2)]
[im 1/33]
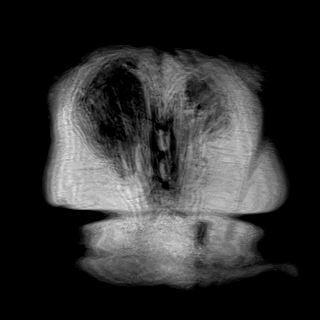
[im 17/33]
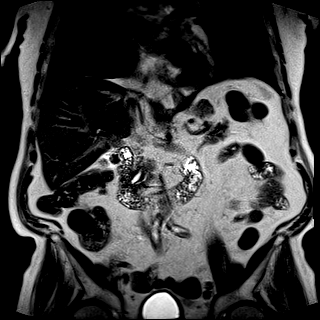
[im 33/33]
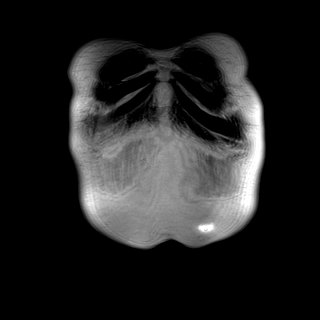

[Series 9: T2 fat-sat · axial · 6.0mm · 1.19mm/px · z∈[-127,+82]mm · 2 of 30 slices shown]
[im 1/30]
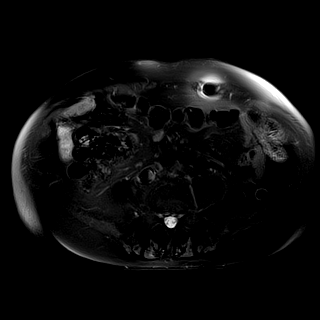
[im 30/30]
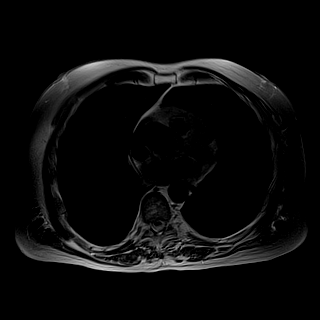

[Series 12: T2 · axial · 6.0mm · 1.19mm/px · z∈[-127,+82]mm · 2 of 30 slices shown (2 of 2)]
[im 1/30]
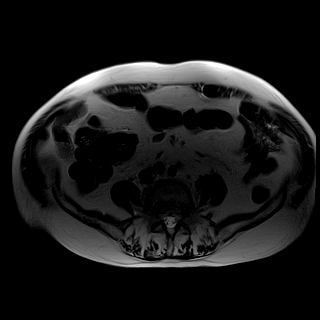
[im 30/30]
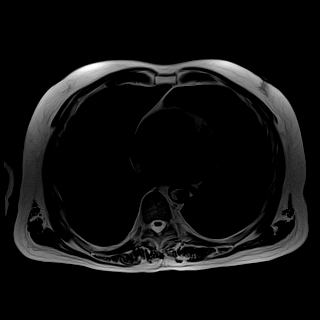

[Series 13: ax dwi_tracew · axial · 6.0mm · 1.42mm/px · z∈[-127,+82]mm · 5 of 90 slices shown]
[im 1/90]
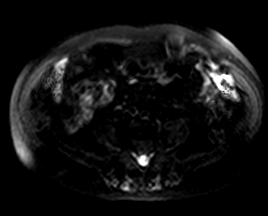
[im 23/90]
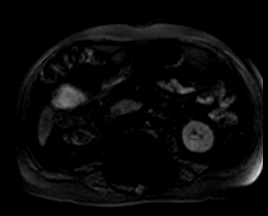
[im 45/90]
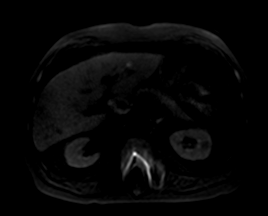
[im 67/90]
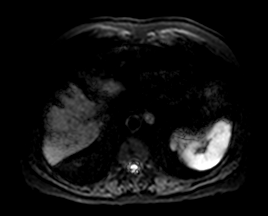
[im 90/90]
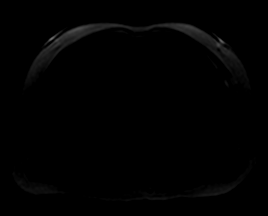

[Series 14: ax dwi_adc · axial · 6.0mm · 1.42mm/px · z∈[-127,+82]mm · 2 of 30 slices shown]
[im 1/30]
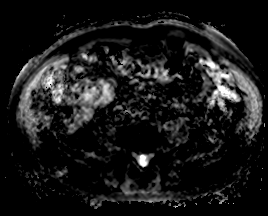
[im 30/30]
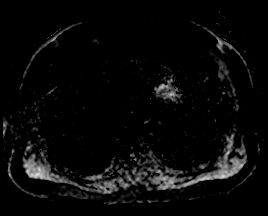

[Series 15: T1 · axial · 6.0mm · 0.74mm/px · z∈[-153,+70]mm · 2 of 32 slices shown (1 of 2)]
[im 1/32]
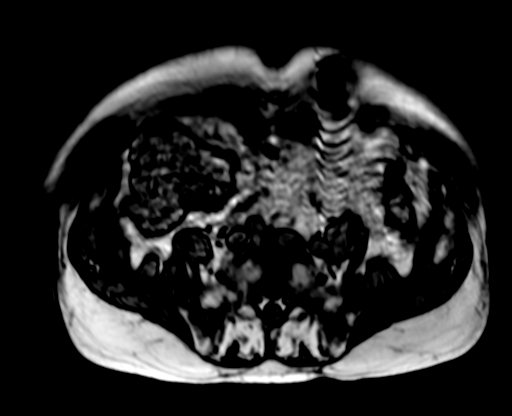
[im 32/32]
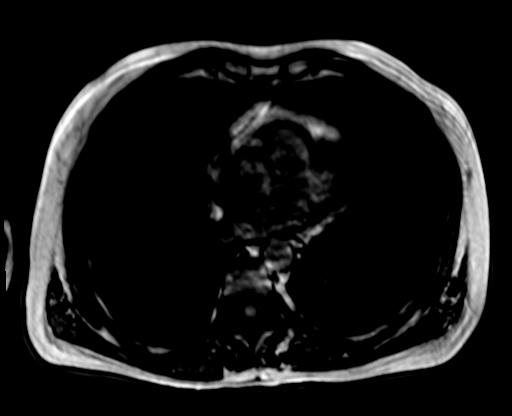

[Series 15: T1 · axial · 6.0mm · 0.74mm/px · z∈[-153,+70]mm · 2 of 32 slices shown (2 of 2)]
[im 1/32]
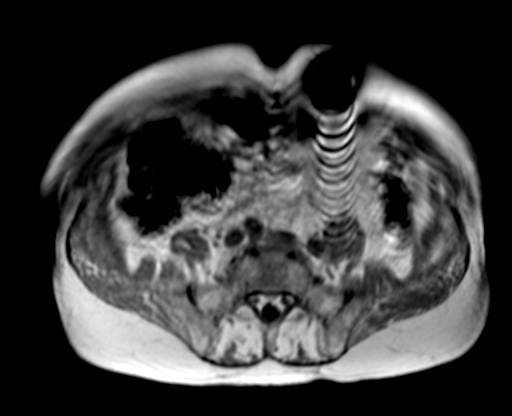
[im 32/32]
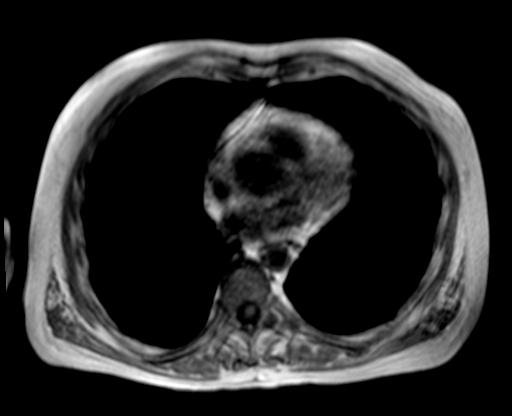

[Series 17: T1 fat-sat · axial · 3.0mm · 1.56mm/px · z∈[-129,+84]mm · 3 of 72 slices shown (1 of 5)]
[im 1/72]
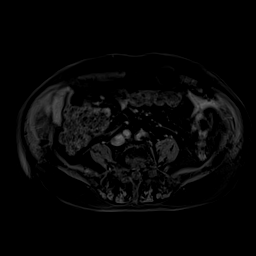
[im 36/72]
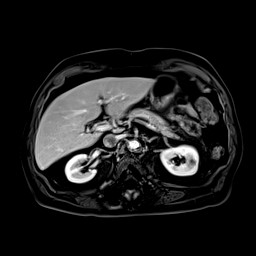
[im 72/72]
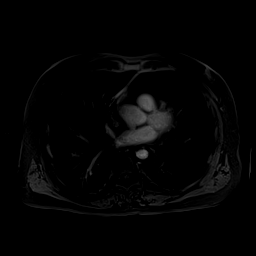

[Series 17: T1 fat-sat · axial · 3.0mm · 1.56mm/px · z∈[-129,+84]mm · 3 of 72 slices shown (2 of 5)]
[im 1/72]
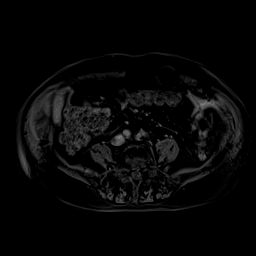
[im 36/72]
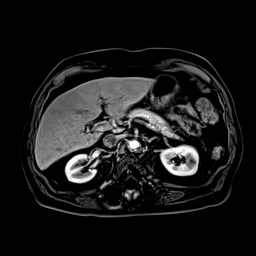
[im 72/72]
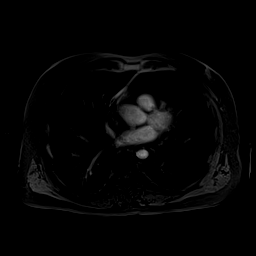

[Series 17: T1 fat-sat · axial · 3.0mm · 1.56mm/px · z∈[-129,+84]mm · 3 of 72 slices shown (3 of 5)]
[im 1/72]
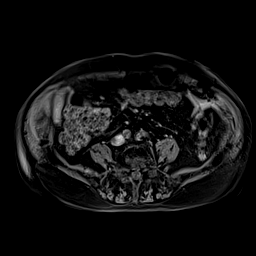
[im 36/72]
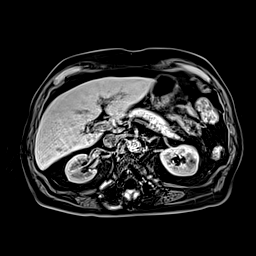
[im 72/72]
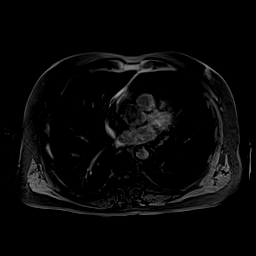

[Series 17: T1 fat-sat · axial · 3.0mm · 1.56mm/px · z∈[-129,+84]mm · 3 of 72 slices shown (4 of 5)]
[im 1/72]
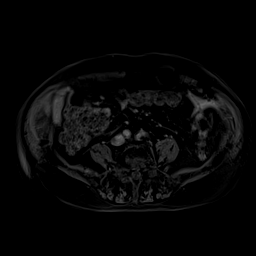
[im 36/72]
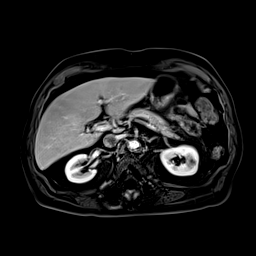
[im 72/72]
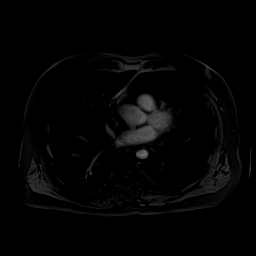

[Series 17: T1 fat-sat · axial · 3.0mm · 1.56mm/px · z∈[-129,+84]mm · 3 of 72 slices shown (5 of 5)]
[im 1/72]
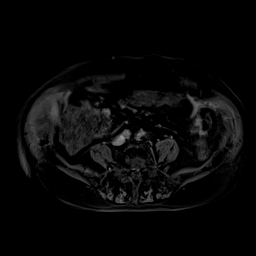
[im 36/72]
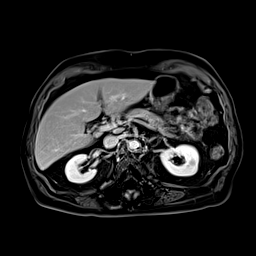
[im 72/72]
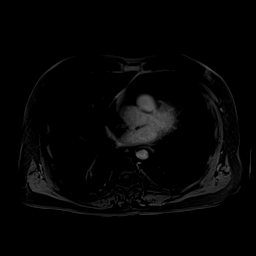

[Series 18: T1 dynamic post-contrast · coronal · 3.0mm · 1.31mm/px · 3 of 72 slices shown]
[im 1/72]
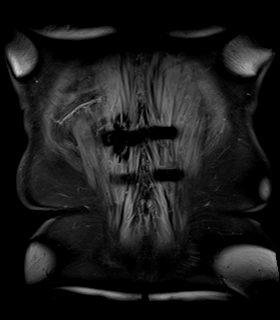
[im 36/72]
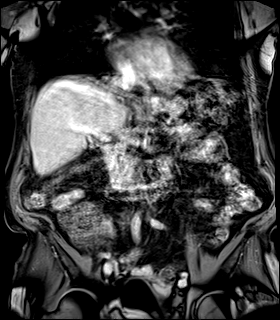
[im 72/72]
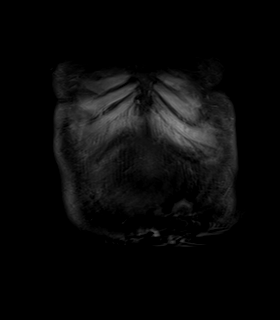

[Series 1020: results sub_p1_t1 fatsat · axial · 3.0mm · 1.56mm/px · z∈[-129,+84]mm · 3 of 72 slices shown (1 of 4)]
[im 1/72]
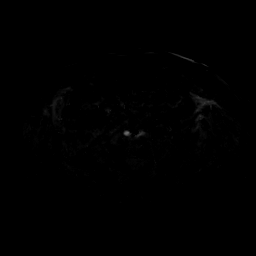
[im 36/72]
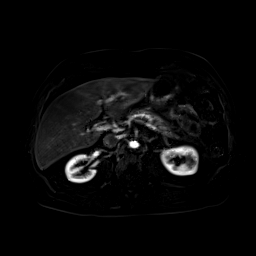
[im 72/72]
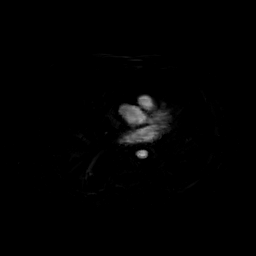

[Series 1020: results sub_p1_t1 fatsat · axial · 3.0mm · 1.56mm/px · z∈[-129,+84]mm · 3 of 72 slices shown (2 of 4)]
[im 1/72]
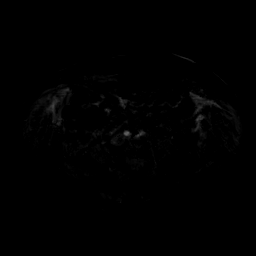
[im 36/72]
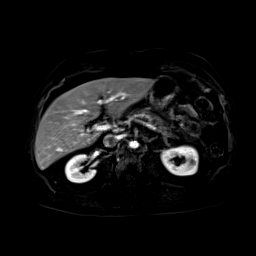
[im 72/72]
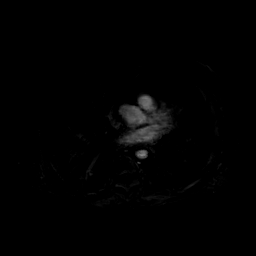

[Series 1020: results sub_p1_t1 fatsat · axial · 3.0mm · 1.56mm/px · z∈[-129,+84]mm · 3 of 72 slices shown (3 of 4)]
[im 1/72]
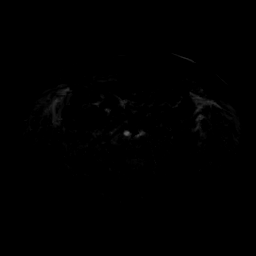
[im 36/72]
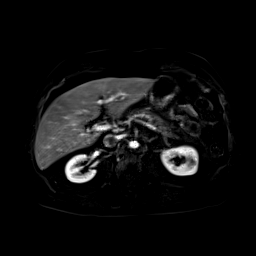
[im 72/72]
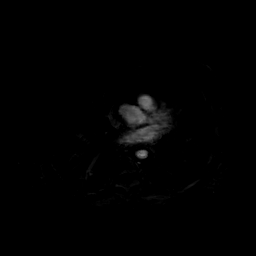

[Series 1020: results sub_p1_t1 fatsat · axial · 3.0mm · 1.56mm/px · z∈[-129,+84]mm · 3 of 72 slices shown (4 of 4)]
[im 1/72]
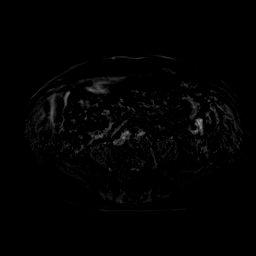
[im 36/72]
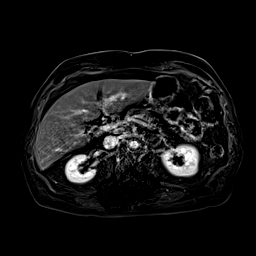
[im 72/72]
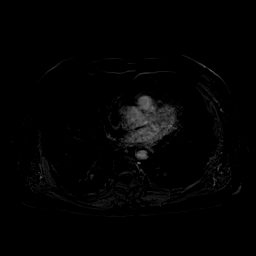

[48 of 48 positions shown; findings below may reference images not displayed]

FINDINGS: Lower chest: No acute findings.

Hepatobiliary: There is a very subtle, fluid signal lesion in the
anterior left lobe of the liver measuring no greater than 7 mm
(series 9, image 16), which appears to demonstrate very subtle
progressive contrast enhancement (series 17, image 33). No mass or
other parenchymal abnormality identified. Large gallstones in the
dependent gallbladder.

Pancreas: No mass, inflammatory changes, or other parenchymal
abnormality identified.

Spleen:  Within normal limits in size and appearance.

Adrenals/Urinary Tract: No masses identified. No evidence of
hydronephrosis.

Stomach/Bowel: Visualized portions within the abdomen are
unremarkable.

Vascular/Lymphatic: No pathologically enlarged lymph nodes
identified. No abdominal aortic aneurysm demonstrated. Aortic
atherosclerosis.

Other:  None.

Musculoskeletal: No suspicious bone lesions identified.
IMPRESSION: 1. There is a very subtle, fluid signal lesion in the anterior left
lobe of the liver measuring no greater than 7 mm, which appears to
demonstrate very subtle progressive contrast enhancement. Although
this tiny lesion is difficult to characterize, given ultrasound
findings and appreciable MR characteristics this is almost certainly
a tiny benign hemangioma. Metastatic disease is distinctly not
favored, particularly given the absence of other suspicious
findings. This could be surveilled at 6 months for stability by
ultrasound if desired.
2. Cholelithiasis.

Aortic Atherosclerosis ([id number]-RQC.C).

## 2023-07-24 ENCOUNTER — Ambulatory Visit (INDEPENDENT_AMBULATORY_CARE_PROVIDER_SITE_OTHER): Payer: Medicare Other

## 2023-07-24 ENCOUNTER — Ambulatory Visit (INDEPENDENT_AMBULATORY_CARE_PROVIDER_SITE_OTHER): Payer: Medicare Other | Admitting: Vascular Surgery

## 2023-07-24 ENCOUNTER — Encounter (INDEPENDENT_AMBULATORY_CARE_PROVIDER_SITE_OTHER): Payer: Self-pay | Admitting: Vascular Surgery

## 2023-07-24 VITALS — BP 109/73 | HR 94 | Resp 18 | Ht 63.0 in | Wt 158.0 lb

## 2023-07-24 DIAGNOSIS — Z8521 Personal history of malignant neoplasm of larynx: Secondary | ICD-10-CM | POA: Diagnosis not present

## 2023-07-24 DIAGNOSIS — E7849 Other hyperlipidemia: Secondary | ICD-10-CM

## 2023-07-24 DIAGNOSIS — I6523 Occlusion and stenosis of bilateral carotid arteries: Secondary | ICD-10-CM | POA: Diagnosis not present

## 2023-07-24 DIAGNOSIS — I771 Stricture of artery: Secondary | ICD-10-CM | POA: Diagnosis not present

## 2023-07-24 NOTE — Assessment & Plan Note (Signed)
His carotid duplex today shows velocities in the 1 to 39% range on the right which could be falsely lowered due to the proximal stenosis.  His left carotid stent is widely patent.  Continue current medical regimen.  Follow-up in 1 year.

## 2023-07-24 NOTE — Progress Notes (Signed)
 MRN : [id number]  Ryan Vazquez is a 89 y.o. (08-16-1934) male who presents with chief complaint of  Chief Complaint  Patient presents with   Follow-up    1 year carotid.  SABRA  History of Present Illness: Patient returns in follow up of carotid disease.  He is a few years status post left carotid stent placement for high-grade stenosis.  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  She says he is definitely been sleeping more and not had as much energy.  No obvious focal neurologic deficits.  He has been diagnosed with vascular dementia and this seems to be progressing.  He has a known innominate stenosis which the family has opted to manage medically which is very reasonable.  His carotid duplex today shows velocities in the 1 to 39% range on the right which could be falsely lowered due to the proximal stenosis.  His left carotid stent is widely patent.  Current Outpatient Medications  Medication Sig Dispense Refill   aspirin  81 MG EC tablet Take by mouth.     atorvastatin  (LIPITOR) 10 MG tablet Take 1 tablet (10 mg total) by mouth daily. 30 tablet 11   B Complex Vitamins (B COMPLEX 1 PO) Take by mouth.     clopidogrel  (PLAVIX ) 75 MG tablet TAKE 1 TABLET BY MOUTH EVERY DAY 90 tablet 2   escitalopram (LEXAPRO) 5 MG tablet Take 5 mg by mouth daily.     fexofenadine (ALLEGRA) 180 MG tablet Take by mouth.     isosorbide mononitrate (IMDUR) 30 MG 24 hr tablet Take by mouth.     levothyroxine  (SYNTHROID , LEVOTHROID) 50 MCG tablet TAKE 1 TABLET ON AN EMPTY STOMACH WITH A GLASS OF WATER AT LEAST 30-60 MINUTES BEFORE BREAKFAST.  11   metoprolol  tartrate (LOPRESSOR ) 100 MG tablet Take 1 tablet (100 mg total) by mouth once for 1 dose. Please take one time dose 100mg  metoprolol  tartrate 2 hr prior to cardiac CT for HR control IF HR >55bpm. 1 tablet 0   Multiple Vitamins-Minerals (CENTRUM SILVER 50+MEN PO) Take by  mouth.     Omega-3 Fatty Acids (FISH OIL) 1000 MG CAPS Take by mouth.     triamcinolone cream (KENALOG) 0.5 % Apply 1 Application topically as needed.     Cholecalciferol 10 MCG (400 UNIT) CAPS Take by mouth.     Docusate Sodium (DSS) 100 MG CAPS Take by mouth.     losartan (COZAAR) 25 MG tablet Take 25 mg by mouth daily.     polyethylene glycol (MIRALAX / GLYCOLAX) 17 g packet Take 17 g by mouth daily. (Patient not taking: Reported on 07/24/2023)     No current facility-administered medications for this visit.    Past Medical History:  Diagnosis Date   Cancer (HCC)    larynx   Depression    History of rectal cancer    Hyperlipidemia    Thyroid disease     Past Surgical History:  Procedure Laterality Date   BACK SURGERY     CAROTID PTA/STENT INTERVENTION Left 09/20/2020   Procedure: CAROTID PTA/STENT INTERVENTION;  Surgeon: Marea Selinda RAMAN, MD;  Location: [facility];  Service: Cardiovascular;  Laterality: Left;   LARYNGECTOMY     LUNG BIOPSY     WRIST SURGERY       Social History   Tobacco Use   Smoking status: Former   Smokeless tobacco: Never  Vaping Use   Vaping status: Never Used  Substance Use Topics   Alcohol use: Not Currently   Drug use: Never      Family History  Problem Relation Age of Onset   Heart disease Father      Allergies  Allergen Reactions   Bupropion Other (See Comments)   Lactose     Other reaction(s): Other (See Comments), Other (See Comments)   Sulfamethoxazole-Trimethoprim Other (See Comments)    cholestasis   Cefuroxime Rash     REVIEW OF SYSTEMS (Negative unless checked)   Constitutional: [] Weight loss  [] Fever  [] Chills Cardiac: [] Chest pain   [] Chest pressure   [] Palpitations   [] Shortness of breath when laying flat   [] Shortness of breath at rest   [] Shortness of breath with exertion. Vascular:  [] Pain in legs with walking   [] Pain in legs at rest   [] Pain in legs when laying flat   [] Claudication   [] Pain in feet when  walking  [] Pain in feet at rest  [] Pain in feet when laying flat   [] History of DVT   [] Phlebitis   [] Swelling in legs   [] Varicose veins   [] Non-healing ulcers Pulmonary:   [] Uses home oxygen   [] Productive cough   [] Hemoptysis   [] Wheeze  [] COPD   [] Asthma Neurologic:  [x] Dizziness  [] Blackouts   [] Seizures   [] History of stroke   [] History of TIA  [] Aphasia   [] Temporary blindness   [] Dysphagia   [x] Weakness or numbness in arms   [] Weakness or numbness in legs Musculoskeletal:  [x] Arthritis   [] Joint swelling   [] Joint pain   [] Low back pain Hematologic:  [] Easy bruising  [] Easy bleeding   [] Hypercoagulable state   [] Anemic  [] Hepatitis Gastrointestinal:  [] Blood in stool   [] Vomiting blood  [] Gastroesophageal reflux/heartburn   [] Abdominal pain Genitourinary:  [] Chronic kidney disease   [] Difficult urination  [] Frequent urination  [] Burning with urination   [] Hematuria Skin:  [] Rashes   [] Ulcers   [] Wounds Psychological:  [] History of anxiety   [x]  History of major depression.  Physical Examination  Vitals:   07/24/23 1046  BP: 109/73  Pulse: 94  Resp: 18  Weight: 158 lb (71.7 kg)  Height: 5' 3 (1.6 m)   Body mass index is 27.99 kg/m. Gen:  WD/WN, NAD Head: [place]/AT, + temporalis wasting. Ear/Nose/Throat: Hearing grossly intact, nares w/o erythema or drainage, trachea midline Eyes: Conjunctiva clear. Sclera non-icteric Neck: Supple. S/p laryngectomy  Pulmonary:  Good air movement, equal and clear to auscultation bilaterally.  Cardiac: RRR, No JVD Vascular:  Vessel Right Left  Radial Palpable Palpable           Musculoskeletal: M/S 5/5 throughout.  No deformity or atrophy. No edema. Neurologic: CN 2-12 intact. Sensation grossly intact in extremities.  Symmetrical.  Speech is absent s/p laryngectomy. Motor exam as listed above. Psychiatric: Judgment  and insight appear poor. Dermatologic: No rashes or ulcers noted.  No cellulitis or open wounds. Lymph : No Cervical, Axillary,  or Inguinal lymphadenopathy.    CBC Lab Results  Component Value Date   WBC 9.2 08/25/2022   HGB 13.5 08/25/2022   HCT 42.5 08/25/2022   MCV 96.4 08/25/2022   PLT 251 08/25/2022    BMET    Component Value Date/Time   NA 138 08/25/2022 1042   K 4.3 08/25/2022 1042   CL 101 08/25/2022 1042   CO2 30 08/25/2022 1042   GLUCOSE 103 (H) 08/25/2022 1042   BUN 33 (H) 08/25/2022 1042  CREATININE 1.02 08/25/2022 1042   CALCIUM  9.1 08/25/2022 1042   GFRNONAA >60 08/25/2022 1042   CrCl cannot be calculated (Patient's most recent lab result is older than the maximum 21 days allowed.).  COAG Lab Results  Component Value Date   INR 1.0 08/25/2022    Radiology No results found.   Assessment/Plan Carotid stenosis His carotid duplex today shows velocities in the 1 to 39% range on the right which could be falsely lowered due to the proximal stenosis.  His left carotid stent is widely patent.  Continue current medical regimen.  Follow-up in 1 year.  History of laryngeal cancer Status post laryngectomy and tracheostomy. He also had radiation which could contribute to his carotid stenosis.   Other hyperlipidemia lipid control important in reducing the progression of atherosclerotic disease. Continue statin therapy   Innominate artery stenosis (HCC) No clear focal neurologic symptoms and he and the family have elected to treat this medically with dual antiplatelet therapy and statin agent which he will continue.  Selinda Gu, MD  07/24/2023 12:48 PM    This note was created with Dragon medical transcription system.  Any errors from dictation are purely unintentional

## 2024-07-22 ENCOUNTER — Ambulatory Visit (INDEPENDENT_AMBULATORY_CARE_PROVIDER_SITE_OTHER): Payer: Medicare Other | Admitting: Vascular Surgery

## 2024-07-22 ENCOUNTER — Encounter (INDEPENDENT_AMBULATORY_CARE_PROVIDER_SITE_OTHER): Payer: Medicare Other
